# Patient Record
Sex: Male | Born: 1950 | Race: White | Hispanic: No | Marital: Married | Smoking: Never smoker
Health system: Southern US, Community
[De-identification: ages and names within clinical notes are randomized; demographics above are authoritative.]

## PROBLEM LIST (undated history)

## (undated) DIAGNOSIS — E039 Hypothyroidism, unspecified: Secondary | ICD-10-CM

## (undated) DIAGNOSIS — M722 Plantar fascial fibromatosis: Secondary | ICD-10-CM

## (undated) DIAGNOSIS — E785 Hyperlipidemia, unspecified: Secondary | ICD-10-CM

## (undated) DIAGNOSIS — E291 Testicular hypofunction: Secondary | ICD-10-CM

## (undated) DIAGNOSIS — F419 Anxiety disorder, unspecified: Secondary | ICD-10-CM

## (undated) DIAGNOSIS — F329 Major depressive disorder, single episode, unspecified: Secondary | ICD-10-CM

## (undated) DIAGNOSIS — N400 Enlarged prostate without lower urinary tract symptoms: Secondary | ICD-10-CM

## (undated) DIAGNOSIS — R079 Chest pain, unspecified: Secondary | ICD-10-CM

## (undated) DIAGNOSIS — F32A Depression, unspecified: Secondary | ICD-10-CM

## (undated) HISTORY — DX: Hypothyroidism, unspecified: E03.9

## (undated) HISTORY — DX: Hyperlipidemia, unspecified: E78.5

## (undated) HISTORY — DX: Testicular hypofunction: E29.1

## (undated) HISTORY — PX: SHOULDER SURGERY: SHX246

## (undated) HISTORY — DX: Benign prostatic hyperplasia without lower urinary tract symptoms: N40.0

## (undated) HISTORY — DX: Chest pain, unspecified: R07.9

---

## 2009-05-23 ENCOUNTER — Encounter: Admission: RE | Admit: 2009-05-23 | Discharge: 2009-05-23 | Payer: Self-pay

## 2012-07-02 ENCOUNTER — Other Ambulatory Visit: Payer: Self-pay | Admitting: *Deleted

## 2012-07-02 DIAGNOSIS — E785 Hyperlipidemia, unspecified: Secondary | ICD-10-CM

## 2012-07-09 ENCOUNTER — Ambulatory Visit
Admission: RE | Admit: 2012-07-09 | Discharge: 2012-07-09 | Disposition: A | Discharge status: Home / Self Care | Payer: No Typology Code available for payment source | Source: Ambulatory Visit | Attending: *Deleted | Admitting: *Deleted

## 2012-07-09 ENCOUNTER — Other Ambulatory Visit: Payer: Self-pay | Admitting: *Deleted

## 2012-07-09 DIAGNOSIS — E785 Hyperlipidemia, unspecified: Secondary | ICD-10-CM

## 2012-07-09 MED ORDER — IOHEXOL 300 MG/ML  SOLN: 75.0000 mL | Freq: Once | INTRAMUSCULAR | Status: AC | PRN

## 2012-07-13 ENCOUNTER — Inpatient Hospital Stay: Admission: RE | Admit: 2012-07-13 | Payer: Self-pay | Source: Ambulatory Visit

## 2013-04-27 ENCOUNTER — Encounter: Payer: Self-pay | Admitting: Cardiovascular Disease

## 2013-04-27 ENCOUNTER — Ambulatory Visit (INDEPENDENT_AMBULATORY_CARE_PROVIDER_SITE_OTHER): Payer: BC Managed Care – PPO | Admitting: Cardiovascular Disease

## 2013-04-27 VITALS — BP 132/84 | HR 64 | Ht 70.5 in | Wt 180.4 lb

## 2013-04-27 DIAGNOSIS — E785 Hyperlipidemia, unspecified: Secondary | ICD-10-CM | POA: Insufficient documentation

## 2013-04-27 DIAGNOSIS — R0602 Shortness of breath: Secondary | ICD-10-CM

## 2013-04-27 DIAGNOSIS — R079 Chest pain, unspecified: Secondary | ICD-10-CM | POA: Insufficient documentation

## 2013-04-27 NOTE — Progress Notes (Signed)
04/27/2013 Dylan Hale   [birth year]  [id number]  Primary Physician Pcp Not In System Primary Cardiologist: [name] MD Roseanne Reno   HPI:  Dylan Hale is a 62 year old fit-appearing married Caucasian male father of 4 children he works in the [employer] laboratory technician. He is self referred for evaluation of chest pain. His only cardiac risk factor is mild untreated hyperlipidemia. He does not smoke. There is no family history of heart disease. He's never had a heart attack or stroke.  He apparently had a stress test 7 years ago to his normal because of chest pain. Her last year, it has become progressively worse occurring several times a week lasting hours at a time. He probably had a CT scan of his chest recently at the request of his primary care physician looking for "plaque" which was apparently normal.   Current Outpatient Prescriptions  Medication Sig Dispense Refill  . aspirin EC 81 MG tablet Take 81 mg by mouth daily.      . Cholecalciferol (VITAMIN D-3) 1000 UNITS CAPS Take 2,000 Units by mouth daily.      Marland Kitchen COENZYME Q10-RED YEAST RICE PO Take 1 capsule by mouth daily.      Marland Kitchen levothyroxine (SYNTHROID, LEVOTHROID) 75 MCG tablet Take 1 tablet by mouth daily.      Marland Kitchen PARoxetine (PAXIL) 30 MG tablet Take 1 tablet by mouth daily.       No current facility-administered medications for this visit.    No Known Allergies  History   Social History  . Marital Status: Unknown    Spouse Name: N/A    Number of Children: N/A  . Years of Education: N/A   Occupational History  . Not on file.   Social History Main Topics  . Smoking status: Never Smoker   . Smokeless tobacco: Never Used  . Alcohol Use: 1 - 2 oz/week    2-4 drink(s) per week  . Drug Use: No  . Sexual Activity: Not on file   Other Topics Concern  . Not on file   Social History Narrative  . No narrative on file     Review of Systems: General: negative for chills, fever, night sweats or weight  changes.  Cardiovascular: negative for chest pain, dyspnea on exertion, edema, orthopnea, palpitations, paroxysmal nocturnal dyspnea or shortness of breath Dermatological: negative for rash Respiratory: negative for cough or wheezing Urologic: negative for hematuria Abdominal: negative for nausea, vomiting, diarrhea, bright red blood per rectum, melena, or hematemesis Neurologic: negative for visual changes, syncope, or dizziness All other systems reviewed and are otherwise negative except as noted above.    Blood pressure 132/84, pulse 64, height 5' 10.5" (1.791 m), weight 180 lb 6.4 oz (81.829 kg).  General appearance: alert and no distress Neck: no adenopathy, no carotid bruit, no JVD, supple, symmetrical, trachea midline and thyroid not enlarged, symmetric, no tenderness/mass/nodules Lungs: clear to auscultation bilaterally Heart: regular rate and rhythm, S1, S2 normal, no murmur, click, rub or gallop Abdomen: soft, non-tender; bowel sounds normal; no masses,  no organomegaly Extremities: extremities normal, atraumatic, no cyanosis or edema Pulses: 2+ and symmetric  EKG normal sinus rhythm of 64 without ST or T wave changes  ASSESSMENT AND PLAN:   Chest pain The patient has complained of chest pain for years and had a negative stress test approximately 7 years ago. Over the last year he's been under a lot of increased pressure at work and at home. He gets chest pain several times  a week lasting hours at a time associated with some difficulty breathing. It sounds like he had a CT of his chest in [place] in the recent past looking at coronary calcium score or "plaque" which was unremarkable  Hyperlipidemia Currently not on statin therapy at his request      [name] MD [facility], [facility] 04/27/2013 4:40 PM

## 2013-04-27 NOTE — Assessment & Plan Note (Signed)
Currently not on statin therapy at his request

## 2013-04-27 NOTE — Assessment & Plan Note (Addendum)
The patient has complained of chest pain for years and had a negative stress test approximately 7 years ago. Over the last year he's been under a lot of increased pressure at work and at home. He gets chest pain several times a week lasting hours at a time associated with some difficulty breathing. It sounds like he had a CT of his chest in [place] in the recent past looking at coronary calcium score or "plaque" which was unremarkable

## 2013-04-27 NOTE — Patient Instructions (Addendum)
  We will see you back in follow up after the tests  Dr Allyson Sabal has ordered an echocardiogram and exercise myoview stress test  Your physician has requested that you have an echocardiogram. Echocardiography is a painless test that uses sound waves to create images of your heart. It provides your doctor with information about the size and shape of your heart and how well your heart's chambers and valves are working. This procedure takes approximately one hour. There are no restrictions for this procedure.  Your physician has requested that you have en exercise stress myoview. For further information please visit [email or web address] Please follow instruction sheet, as given.

## 2013-05-02 ENCOUNTER — Ambulatory Visit (HOSPITAL_COMMUNITY)
Admission: RE | Admit: 2013-05-02 | Discharge: 2013-05-02 | Disposition: A | Discharge status: Home / Self Care | Payer: BC Managed Care – PPO | Source: Ambulatory Visit | Attending: Cardiology | Admitting: Cardiology

## 2013-05-02 ENCOUNTER — Encounter: Payer: Self-pay | Admitting: *Deleted

## 2013-05-02 DIAGNOSIS — E785 Hyperlipidemia, unspecified: Secondary | ICD-10-CM | POA: Insufficient documentation

## 2013-05-02 DIAGNOSIS — R079 Chest pain, unspecified: Secondary | ICD-10-CM

## 2013-05-02 DIAGNOSIS — R0602 Shortness of breath: Secondary | ICD-10-CM | POA: Insufficient documentation

## 2013-05-02 NOTE — Progress Notes (Signed)
2D Echo Performed 05/02/2013    Sagal Gayton, RCS  

## 2013-05-03 ENCOUNTER — Encounter: Payer: Self-pay | Admitting: *Deleted

## 2013-05-03 ENCOUNTER — Ambulatory Visit (HOSPITAL_COMMUNITY)
Admission: RE | Admit: 2013-05-03 | Discharge: 2013-05-03 | Disposition: A | Discharge status: Home / Self Care | Payer: BC Managed Care – PPO | Source: Ambulatory Visit | Attending: Cardiovascular Disease | Admitting: Cardiovascular Disease

## 2013-05-03 DIAGNOSIS — R0602 Shortness of breath: Secondary | ICD-10-CM

## 2013-05-03 DIAGNOSIS — R5381 Other malaise: Secondary | ICD-10-CM | POA: Insufficient documentation

## 2013-05-03 DIAGNOSIS — R002 Palpitations: Secondary | ICD-10-CM | POA: Insufficient documentation

## 2013-05-03 DIAGNOSIS — R079 Chest pain, unspecified: Secondary | ICD-10-CM

## 2013-05-03 MED ORDER — TECHNETIUM TC 99M SESTAMIBI GENERIC - CARDIOLITE
10.0000 | Freq: Once | INTRAVENOUS | Status: AC | PRN
  Administered 2013-05-03: 10 via INTRAVENOUS

## 2013-05-03 MED ORDER — TECHNETIUM TC 99M SESTAMIBI GENERIC - CARDIOLITE
30.0000 | Freq: Once | INTRAVENOUS | Status: AC | PRN
  Administered 2013-05-03: 30 via INTRAVENOUS

## 2013-05-03 NOTE — Procedures (Addendum)
[facility] [address] [place] [address] [phone]  Cardiology Nuclear Med Study  Dylan Hale is a 62 y.o. male     MRN : [id number]     DOB: [birth year]  Procedure Date: 05/03/2013  Nuclear Med Background Indication for Stress Test:  Evaluation for Ischemia History:  PT DENIES ANY PRIOR HISTORY Cardiac Risk Factors: Lipids and Overweight  Symptoms:  Chest Pain, Fatigue, Light-Headedness, Palpitations and SOB   Nuclear Pre-Procedure Caffeine/Decaff Intake:  7:00pm NPO After: 5:00am   IV Site: R Hand  IV 0.9% NS with Angio Cath:  22g  Chest Size (in):  42"  IV Started by: Emmit Pomfret, RN  Height: 5' 10.5" (1.791 m)  Cup Size: n/a  BMI:  Body mass index is 25.45 kg/(m^2). Weight:  180 lb (81.647 kg)   Tech Comments:  N/A    Nuclear Med Study 1 or 2 day study: 1 day  Stress Test Type:  Stress  Order Authorizing Provider:  Nanetta Batty, MD   Resting Radionuclide: Technetium 82m Sestamibi  Resting Radionuclide Dose: 10.5 mCi   Stress Radionuclide:  Technetium 22m Sestamibi  Stress Radionuclide Dose: 30.1 mCi           Stress Protocol Rest HR: 53 Stress HR: 160  Rest BP:120/89 Stress BP: 185/81  Exercise Time (min): 11:31 METS: 12.40   Predicted Max HR: 159 bpm % Max HR: 100.63 bpm Rate Pressure Product: 46962  Dose of Adenosine (mg):  n/a Dose of Lexiscan: n/a mg  Dose of Atropine (mg): n/a Dose of Dobutamine: n/a mcg/kg/min (at max HR)  Stress Test Technologist: Ernestene Mention, CCT Nuclear Technologist: Koren Shiver, CNMT   Rest Procedure:  Myocardial perfusion imaging was performed at rest 45 minutes following the intravenous administration of Technetium 58m Sestamibi. Stress Procedure:  The patient performed treadmill exercise using a Bruce  Protocol for 11 minutes and 31 seconds. The patient stopped due to shortness of breath and fatigue. Patient denied any chest pain.  There were no  significant ST-T wave changes.  Technetium 86m Sestamibi was injected at peak exercise and myocardial perfusion imaging was performed after a brief delay.  Transient Ischemic Dilatation (Normal <1.22):  0.86  Lung/Heart Ratio (Normal <0.45):  0.38 QGS EDV:  95 ml QGS ESV:  45 ml LV Ejection Fraction: 53%  Rest ECG: NSR - Normal EKG  Stress ECG: Uninterpretable due to baseline artifact  QPS Raw Data Images:  Normal; no motion artifact; normal heart/lung ratio. Stress Images:  Normal homogeneous uptake in all areas of the myocardium. Rest Images:  Normal homogeneous uptake in all areas of the myocardium. Subtraction (SDS):  No evidence of ischemia.  Impression Exercise Capacity:  Excellent exercise capacity. BP Response:  Hypertensive blood pressure response. Clinical Symptoms:  No significant symptoms noted. ECG Impression:  Stress EKG is uninterpretable due to baseline artifact, occasional PVC's Comparison with Prior Nuclear Study: No previous nuclear study performed  Overall Impression:  Normal stress nuclear study. EKG was uninterpretable at peak exercise due to lead artifact. Excellent exercise tolerance and no chest pain with exercise.  LV Wall Motion:  NL LV Function; NL Wall Motion; EF 53%  Chrystie Nose, MD, [facility] Board Certified in Nuclear Cardiology Attending Cardiologist [facility]  Chrystie Nose, MD  05/03/2013 12:02 PM

## 2013-05-23 ENCOUNTER — Ambulatory Visit (INDEPENDENT_AMBULATORY_CARE_PROVIDER_SITE_OTHER): Payer: BC Managed Care – PPO | Admitting: Cardiovascular Disease

## 2013-05-23 ENCOUNTER — Encounter: Payer: Self-pay | Admitting: Cardiovascular Disease

## 2013-05-23 VITALS — BP 138/88 | HR 60 | Ht 70.5 in | Wt 179.1 lb

## 2013-05-23 DIAGNOSIS — R079 Chest pain, unspecified: Secondary | ICD-10-CM

## 2013-05-23 NOTE — Progress Notes (Signed)
Dylan Hale returns today for followup of his outpatient diagnostic studies. A 2-D echo and Myoview stress test were normal. I reassured him that the likelihood of his symptoms being related to a cardiovascular etiology is small. I will see him back in one year  [name], M.D., [facility] [address] [place], [place]  [address]  [phone] 05/23/2013 10:26 AM

## 2013-05-23 NOTE — Assessment & Plan Note (Signed)
Myoview stress test and 2-D echo were entirely normal. I have reassured Dylan Hale that the likelihood of his symptoms being related to a cardiac cause a small especially in light of the fact that his cardiac CT angiogram was also normal.

## 2013-05-23 NOTE — Patient Instructions (Addendum)
Your physician wants you to follow-up in: 1 year with Dr Berry. You will receive a reminder letter in the mail two months in advance. If you don't receive a letter, please call our office to schedule the follow-up appointment.  

## 2013-05-31 ENCOUNTER — Encounter: Payer: Self-pay | Admitting: Cardiovascular Disease

## 2013-06-01 ENCOUNTER — Ambulatory Visit: Payer: Self-pay | Admitting: Cardiology

## 2013-06-02 ENCOUNTER — Ambulatory Visit: Payer: Self-pay | Admitting: Cardiology

## 2013-06-17 ENCOUNTER — Other Ambulatory Visit: Payer: Self-pay | Admitting: Family Medicine

## 2013-06-17 DIAGNOSIS — R05 Cough: Secondary | ICD-10-CM

## 2013-06-20 ENCOUNTER — Ambulatory Visit (INDEPENDENT_AMBULATORY_CARE_PROVIDER_SITE_OTHER): Payer: BC Managed Care – PPO

## 2013-06-20 DIAGNOSIS — R05 Cough: Secondary | ICD-10-CM

## 2013-06-20 DIAGNOSIS — R0602 Shortness of breath: Secondary | ICD-10-CM

## 2015-07-23 ENCOUNTER — Other Ambulatory Visit: Payer: Self-pay | Admitting: Orthopedic Surgery

## 2015-07-27 ENCOUNTER — Encounter (HOSPITAL_BASED_OUTPATIENT_CLINIC_OR_DEPARTMENT_OTHER): Payer: Self-pay | Admitting: *Deleted

## 2015-08-02 ENCOUNTER — Ambulatory Visit (HOSPITAL_BASED_OUTPATIENT_CLINIC_OR_DEPARTMENT_OTHER): Payer: BLUE CROSS/BLUE SHIELD | Admitting: Certified Registered"

## 2015-08-02 ENCOUNTER — Encounter (HOSPITAL_BASED_OUTPATIENT_CLINIC_OR_DEPARTMENT_OTHER): Payer: Self-pay

## 2015-08-02 ENCOUNTER — Encounter (HOSPITAL_BASED_OUTPATIENT_CLINIC_OR_DEPARTMENT_OTHER)
Admission: RE | Disposition: A | Discharge status: Home / Self Care | Payer: Self-pay | Source: Ambulatory Visit | Attending: Orthopedic Surgery

## 2015-08-02 ENCOUNTER — Ambulatory Visit (HOSPITAL_BASED_OUTPATIENT_CLINIC_OR_DEPARTMENT_OTHER)
Admission: RE | Admit: 2015-08-02 | Discharge: 2015-08-02 | Disposition: A | Discharge status: Home / Self Care | Payer: BLUE CROSS/BLUE SHIELD | Source: Ambulatory Visit | Attending: Orthopedic Surgery | Admitting: Orthopedic Surgery

## 2015-08-02 DIAGNOSIS — E291 Testicular hypofunction: Secondary | ICD-10-CM | POA: Diagnosis not present

## 2015-08-02 DIAGNOSIS — D2121 Benign neoplasm of connective and other soft tissue of right lower limb, including hip: Secondary | ICD-10-CM | POA: Diagnosis not present

## 2015-08-02 DIAGNOSIS — F329 Major depressive disorder, single episode, unspecified: Secondary | ICD-10-CM | POA: Insufficient documentation

## 2015-08-02 DIAGNOSIS — F419 Anxiety disorder, unspecified: Secondary | ICD-10-CM | POA: Diagnosis not present

## 2015-08-02 DIAGNOSIS — E785 Hyperlipidemia, unspecified: Secondary | ICD-10-CM | POA: Insufficient documentation

## 2015-08-02 DIAGNOSIS — M25571 Pain in right ankle and joints of right foot: Secondary | ICD-10-CM

## 2015-08-02 DIAGNOSIS — Z79899 Other long term (current) drug therapy: Secondary | ICD-10-CM | POA: Diagnosis not present

## 2015-08-02 DIAGNOSIS — Z7982 Long term (current) use of aspirin: Secondary | ICD-10-CM | POA: Insufficient documentation

## 2015-08-02 DIAGNOSIS — E039 Hypothyroidism, unspecified: Secondary | ICD-10-CM | POA: Insufficient documentation

## 2015-08-02 DIAGNOSIS — N400 Enlarged prostate without lower urinary tract symptoms: Secondary | ICD-10-CM | POA: Diagnosis not present

## 2015-08-02 HISTORY — DX: Plantar fascial fibromatosis: M72.2

## 2015-08-02 HISTORY — DX: Depression, unspecified: F32.A

## 2015-08-02 HISTORY — DX: Major depressive disorder, single episode, unspecified: F32.9

## 2015-08-02 HISTORY — PX: MASS EXCISION: SHX2000

## 2015-08-02 HISTORY — DX: Anxiety disorder, unspecified: F41.9

## 2015-08-02 SURGERY — EXCISION MASS
Anesthesia: General | Site: Foot | Laterality: Right

## 2015-08-02 MED ORDER — FENTANYL CITRATE (PF) 100 MCG/2ML IJ SOLN
INTRAMUSCULAR | Status: AC
  Filled 2015-08-02: qty 2

## 2015-08-02 MED ORDER — HYDROCODONE-ACETAMINOPHEN 5-325 MG PO TABS: 1.0000 | ORAL_TABLET | ORAL | Status: DC | PRN

## 2015-08-02 MED ORDER — DEXAMETHASONE SODIUM PHOSPHATE 10 MG/ML IJ SOLN
INTRAMUSCULAR | Status: DC | PRN
Start: 2015-08-02 — End: 2015-08-02
  Administered 2015-08-02: 10 mg via INTRAVENOUS

## 2015-08-02 MED ORDER — MIDAZOLAM HCL 2 MG/2ML IJ SOLN
INTRAMUSCULAR | Status: AC
  Filled 2015-08-02: qty 2

## 2015-08-02 MED ORDER — LIDOCAINE HCL (CARDIAC) 20 MG/ML IV SOLN
INTRAVENOUS | Status: AC
  Filled 2015-08-02: qty 5

## 2015-08-02 MED ORDER — DEXAMETHASONE SODIUM PHOSPHATE 10 MG/ML IJ SOLN
INTRAMUSCULAR | Status: AC
  Filled 2015-08-02: qty 1

## 2015-08-02 MED ORDER — LACTATED RINGERS IV SOLN
INTRAVENOUS | Status: DC
  Administered 2015-08-02 (×2): via INTRAVENOUS

## 2015-08-02 MED ORDER — PROPOFOL 10 MG/ML IV BOLUS
INTRAVENOUS | Status: DC | PRN
  Administered 2015-08-02: 200 mg via INTRAVENOUS

## 2015-08-02 MED ORDER — EPHEDRINE SULFATE 50 MG/ML IJ SOLN
INTRAMUSCULAR | Status: DC | PRN
  Administered 2015-08-02: 10 mg via INTRAVENOUS

## 2015-08-02 MED ORDER — FENTANYL CITRATE (PF) 100 MCG/2ML IJ SOLN
50.0000 ug | INTRAMUSCULAR | Status: DC | PRN
  Administered 2015-08-02: 100 ug via INTRAVENOUS

## 2015-08-02 MED ORDER — GLYCOPYRROLATE 0.2 MG/ML IJ SOLN: 0.2000 mg | Freq: Once | INTRAMUSCULAR | Status: DC | PRN

## 2015-08-02 MED ORDER — ONDANSETRON HCL 4 MG/2ML IJ SOLN
INTRAMUSCULAR | Status: AC
  Filled 2015-08-02: qty 2

## 2015-08-02 MED ORDER — ONDANSETRON HCL 4 MG/2ML IJ SOLN
INTRAMUSCULAR | Status: DC | PRN
  Administered 2015-08-02: 4 mg via INTRAVENOUS

## 2015-08-02 MED ORDER — SCOPOLAMINE 1 MG/3DAYS TD PT72: 1.0000 | MEDICATED_PATCH | Freq: Once | TRANSDERMAL | Status: DC | PRN

## 2015-08-02 MED ORDER — CHLORHEXIDINE GLUCONATE 4 % EX LIQD: 60.0000 mL | Freq: Once | CUTANEOUS | Status: DC

## 2015-08-02 MED ORDER — MIDAZOLAM HCL 2 MG/2ML IJ SOLN
1.0000 mg | INTRAMUSCULAR | Status: DC | PRN
  Administered 2015-08-02: 2 mg via INTRAVENOUS

## 2015-08-02 MED ORDER — PROPOFOL 10 MG/ML IV BOLUS
INTRAVENOUS | Status: AC
  Filled 2015-08-02: qty 20

## 2015-08-02 MED ORDER — DOCUSATE SODIUM 100 MG PO CAPS: 100.0000 mg | ORAL_CAPSULE | Freq: Two times a day (BID) | ORAL | Status: DC

## 2015-08-02 MED ORDER — SODIUM CHLORIDE 0.9 % IV SOLN: INTRAVENOUS | Status: DC

## 2015-08-02 MED ORDER — CEFAZOLIN SODIUM-DEXTROSE 2-3 GM-% IV SOLR
2.0000 g | INTRAVENOUS | Status: AC
  Administered 2015-08-02: 2 g via INTRAVENOUS

## 2015-08-02 MED ORDER — CEFAZOLIN SODIUM-DEXTROSE 2-3 GM-% IV SOLR
INTRAVENOUS | Status: AC
  Filled 2015-08-02: qty 50

## 2015-08-02 MED ORDER — SENNA 8.6 MG PO TABS: 2.0000 | ORAL_TABLET | Freq: Two times a day (BID) | ORAL | Status: DC

## 2015-08-02 MED ORDER — EPHEDRINE SULFATE 50 MG/ML IJ SOLN
INTRAMUSCULAR | Status: AC
  Filled 2015-08-02: qty 1

## 2015-08-02 SURGICAL SUPPLY — 65 items
BANDAGE ESMARK 6X9 LF (GAUZE/BANDAGES/DRESSINGS) IMPLANT
BLADE SURG 15 STRL LF DISP TIS (BLADE) ×1 IMPLANT
BLADE SURG 15 STRL SS (BLADE) ×2
BNDG COHESIVE 4X5 TAN STRL (GAUZE/BANDAGES/DRESSINGS) IMPLANT
BNDG COHESIVE 6X5 TAN STRL LF (GAUZE/BANDAGES/DRESSINGS) IMPLANT
BNDG CONFORM 3 STRL LF (GAUZE/BANDAGES/DRESSINGS) IMPLANT
BNDG ESMARK 4X9 LF (GAUZE/BANDAGES/DRESSINGS) IMPLANT
BNDG ESMARK 6X9 LF (GAUZE/BANDAGES/DRESSINGS)
CHLORAPREP W/TINT 26ML (MISCELLANEOUS) ×3 IMPLANT
CLOSURE WOUND 1/2 X4 (GAUZE/BANDAGES/DRESSINGS)
COVER BACK TABLE 60X90IN (DRAPES) ×3 IMPLANT
CUFF TOURNIQUET SINGLE 34IN LL (TOURNIQUET CUFF) IMPLANT
DRAPE EXTREMITY T 121X128X90 (DRAPE) ×3 IMPLANT
DRAPE OEC MINIVIEW 54X84 (DRAPES) IMPLANT
DRAPE SURG 17X23 STRL (DRAPES) IMPLANT
DRAPE U-SHAPE 47X51 STRL (DRAPES) IMPLANT
DRSG MEPITEL 4X7.2 (GAUZE/BANDAGES/DRESSINGS) ×3 IMPLANT
DRSG PAD ABDOMINAL 8X10 ST (GAUZE/BANDAGES/DRESSINGS) ×3 IMPLANT
ELECT REM PT RETURN 9FT ADLT (ELECTROSURGICAL) ×3
ELECTRODE REM PT RTRN 9FT ADLT (ELECTROSURGICAL) ×1 IMPLANT
GAUZE SPONGE 4X4 12PLY STRL (GAUZE/BANDAGES/DRESSINGS) ×6 IMPLANT
GAUZE SPONGE 4X4 16PLY XRAY LF (GAUZE/BANDAGES/DRESSINGS) IMPLANT
GLOVE BIO SURGEON STRL SZ8 (GLOVE) ×3 IMPLANT
GLOVE BIOGEL PI IND STRL 7.0 (GLOVE) ×1 IMPLANT
GLOVE BIOGEL PI IND STRL 8 (GLOVE) ×2 IMPLANT
GLOVE BIOGEL PI INDICATOR 7.0 (GLOVE) ×2
GLOVE BIOGEL PI INDICATOR 8 (GLOVE) ×4
GLOVE ECLIPSE 6.5 STRL STRAW (GLOVE) ×3 IMPLANT
GLOVE ECLIPSE 7.5 STRL STRAW (GLOVE) ×3 IMPLANT
GLOVE EXAM NITRILE MD LF STRL (GLOVE) IMPLANT
GOWN STRL REUS W/ TWL LRG LVL3 (GOWN DISPOSABLE) ×1 IMPLANT
GOWN STRL REUS W/ TWL XL LVL3 (GOWN DISPOSABLE) ×2 IMPLANT
GOWN STRL REUS W/TWL LRG LVL3 (GOWN DISPOSABLE) ×2
GOWN STRL REUS W/TWL XL LVL3 (GOWN DISPOSABLE) ×4
NEEDLE HYPO 22GX1.5 SAFETY (NEEDLE) IMPLANT
NEEDLE HYPO 25X1 1.5 SAFETY (NEEDLE) IMPLANT
NS IRRIG 1000ML POUR BTL (IV SOLUTION) ×3 IMPLANT
PACK BASIN DAY SURGERY FS (CUSTOM PROCEDURE TRAY) ×3 IMPLANT
PAD CAST 4YDX4 CTTN HI CHSV (CAST SUPPLIES) ×1 IMPLANT
PADDING CAST ABS 4INX4YD NS (CAST SUPPLIES)
PADDING CAST ABS COTTON 4X4 ST (CAST SUPPLIES) IMPLANT
PADDING CAST COTTON 4X4 STRL (CAST SUPPLIES) ×2
PADDING CAST COTTON 6X4 STRL (CAST SUPPLIES) IMPLANT
PENCIL BUTTON HOLSTER BLD 10FT (ELECTRODE) IMPLANT
SANITIZER HAND PURELL 535ML FO (MISCELLANEOUS) ×3 IMPLANT
SHEET MEDIUM DRAPE 40X70 STRL (DRAPES) ×3 IMPLANT
SLEEVE SCD COMPRESS KNEE MED (MISCELLANEOUS) ×3 IMPLANT
SPONGE LAP 18X18 X RAY DECT (DISPOSABLE) ×3 IMPLANT
STOCKINETTE 6  STRL (DRAPES) ×2
STOCKINETTE 6 STRL (DRAPES) ×1 IMPLANT
STRIP CLOSURE SKIN 1/2X4 (GAUZE/BANDAGES/DRESSINGS) IMPLANT
SUCTION FRAZIER TIP 10 FR DISP (SUCTIONS) IMPLANT
SUT ETHILON 3 0 PS 1 (SUTURE) IMPLANT
SUT ETHILON 4 0 PS 2 18 (SUTURE) IMPLANT
SUT MNCRL AB 3-0 PS2 18 (SUTURE) IMPLANT
SUT MNCRL AB 4-0 PS2 18 (SUTURE) IMPLANT
SUT VIC AB 0 SH 27 (SUTURE) IMPLANT
SUT VIC AB 2-0 PS2 27 (SUTURE) IMPLANT
SYR BULB 3OZ (MISCELLANEOUS) ×3 IMPLANT
SYR CONTROL 10ML LL (SYRINGE) IMPLANT
TOWEL OR 17X24 6PK STRL BLUE (TOWEL DISPOSABLE) ×3 IMPLANT
TUBE CONNECTING 20'X1/4 (TUBING)
TUBE CONNECTING 20X1/4 (TUBING) IMPLANT
UNDERPAD 30X30 (UNDERPADS AND DIAPERS) ×3 IMPLANT
YANKAUER SUCT BULB TIP NO VENT (SUCTIONS) IMPLANT

## 2015-08-02 NOTE — Brief Op Note (Signed)
08/02/2015  9:52 AM  PATIENT:  [name]  64 y.o. male  PRE-OPERATIVE DIAGNOSIS:  Right foot plantar fibroma  POST-OPERATIVE DIAGNOSIS:  right foot plantar fibroma  Procedure(s): EXCISION OF RIGHT FOOT MASS 1 cm x 2 cm x 1 cm deep to the fascia  SURGEON:  Wylene Simmer, MD  ASSISTANT: Mechele Claude, PA-C  ANESTHESIA:   General, regional  EBL:  minimal   TOURNIQUET:   Total Tourniquet Time Documented: Leg (Right) - 8 minutes Total: Leg (Right) - 8 minutes  COMPLICATIONS:  None apparent  DISPOSITION:  Extubated, awake and stable to recovery.  DICTATION ID:  [id number]

## 2015-08-02 NOTE — Op Note (Signed)
NAME:  MAZIN, FLORKOWSKI NO.:  [id number]  MEDICAL RECORD NO.:  [id number]  LOCATION:                                 FACILITY:  PHYSICIAN:  Wylene Simmer, MD             DATE OF BIRTH:  DATE OF PROCEDURE:  08/02/2015 DATE OF DISCHARGE:                              OPERATIVE REPORT   PREOPERATIVE DIAGNOSIS:  Right foot plantar fibroma.  PREOPERATIVE DIAGNOSIS:  Right foot plantar fibroma.  PROCEDURE:  Excision of right foot mass, 1 cm x 2 cm x 1 cm deep to the fascia.  SURGEON:  Wylene Simmer, MD  ASSISTANT:  Mechele Claude, PA-C.  ANESTHESIA:  General, regional.  ESTIMATED BLOOD LOSS:  Minimal.  TOURNIQUET TIME:  8 minutes with an ankle Esmarch.  COMPLICATIONS:  None apparent.  DISPOSITION:  Extubated, awake, and stable to recovery.  INDICATIONS FOR PROCEDURE:  The patient is a 64 year old male, who complains of a painful mass, this has been slowly growing at the right plantar aspect of his forefoot.  He has failed nonoperative treatment and presents today for surgical excision.  He understands the risks and benefits, the alternative treatment options, and elects surgical treatment.  He specifically understands the risks of bleeding, infection, nerve damage, blood clots, need for additional surgery, continued pain, recurrence of the mass, amputation and death.  PROCEDURE IN DETAIL:  After preoperative consent was obtained and the correct operative site was identified, the patient was brought to the operating room and placed supine on the operating table.  General anesthesia was induced.  Preoperative antibiotics were administered. Surgical time-out was taken.  The right lower extremity was prepped and draped in standard sterile fashion.  Foot was exsanguinated and a 4-inch Esmarch tourniquet was wrapped around the ankle.  A longitudinal incision was then made medial to the mass at the glabrous border of the skin.  Sharp dissection was carried down  through the skin.  Blunt dissection was then carried down through the subcutaneous tissue to the medial aspect of the mass.  The mass was then bluntly dissected circumferentially.  It was continuous with the plantar fascia.  The mass was then excised in its entirety and passed off the field as a specimen to Pathology.  The plantar fascia was then palpated and another nodule was noted just lateral to the first.  This was also excised and sent to Pathology.  The wound was irrigated copiously.  Tourniquet was released. Hemostasis was achieved.  The incision was closed with horizontal mattress sutures of 3-0 nylon.  Sterile dressings were applied followed by compression wrap.  The patient was awakened from anesthesia and transported to the recovery room in stable condition.  FOLLOWUP PLAN:  The patient will be weightbearing as tolerated on his right foot in a flat postop shoe.  He will follow up with me in the office in 2 weeks for suture removal and path results.  Mechele Claude, PA-C was present and scrubbed for the duration of the case.  His assistance was essential in positioning of the patient, prepping and draping, gaining and maintaining exposure, performing the operation, closing and dressing the wounds and applying the splint.  Wylene Simmer, MD     JH/MEDQ  D:  08/02/2015  T:  08/02/2015  Job:  [id number]

## 2015-08-02 NOTE — Anesthesia Postprocedure Evaluation (Signed)
Anesthesia Post Note  Patient: Dylan Hale  Procedure(s) Performed: Procedure(s) (LRB): EXCISION OF RIGHT FOOT MASS (Right)  Patient location during evaluation: PACU Anesthesia Type: General Level of consciousness: sedated Pain management: pain level controlled Vital Signs Assessment: post-procedure vital signs reviewed and stable Respiratory status: spontaneous breathing and respiratory function stable Cardiovascular status: stable Anesthetic complications: no    Last Vitals:  Filed Vitals:   08/02/15 0945 08/02/15 1000  BP: 97/68 115/68  Pulse: 55   Temp:    Resp: 11     Last Pain:  Filed Vitals:   08/02/15 1002  PainSc: Asleep          RLE Sensation: Numbness (08/02/15 1000)      Ari Engelbrecht DANIEL

## 2015-08-02 NOTE — H&P (Deleted)
Dylan Hale is an 64 y.o. male.   Chief Complaint: right foot mass HPI: 64 y/o male with right plantar foot mass that is painful and slowly growing.  He desires surgical excision.  Past Medical History  Diagnosis Date  . Hypothyroidism   . Hyperlipidemia   . Enlarged prostate   . Hypogonadism male   . Chest pain   . Anxiety   . Depression   . Plantar fibromatosis     right foot    History reviewed. No pertinent past surgical history.  Family History  Problem Relation Age of Onset  . Anxiety disorder Mother   . Prostate cancer Father   . Colon cancer Maternal Grandmother   . Emphysema Maternal Grandfather   . Lung cancer Paternal Grandfather    Social History:  reports that he has never smoked. He has never used smokeless tobacco. He reports that he drinks about 1.0 - 2.0 oz of alcohol per week. He reports that he does not use illicit drugs.  Allergies: No Known Allergies  Medications Prior to Admission  Medication Sig Dispense Refill  . aspirin EC 81 MG tablet Take 81 mg by mouth daily.    . Cholecalciferol (VITAMIN D-3) 1000 UNITS CAPS Take 2,000 Units by mouth daily.    Marland Kitchen COENZYME Q10-RED YEAST RICE PO Take 1 capsule by mouth daily.    Marland Kitchen levothyroxine (SYNTHROID, LEVOTHROID) 75 MCG tablet Take 1 tablet by mouth daily.    . Omega-3 Fatty Acids (FISH OIL) 1000 MG CAPS Take by mouth.    Marland Kitchen PARoxetine (PAXIL) 30 MG tablet Take 1 tablet by mouth daily.    Marland Kitchen testosterone (ANDROGEL) 50 MG/5GM (1%) GEL Place 5 g onto the skin daily.      No results found for this or any previous visit (from the past 48 hour(s)). No results found.  ROS  No recent f/c/n/v/wt loss  Blood pressure 128/78, pulse 61, temperature 97.1 F (36.2 C), temperature source Oral, resp. rate 17, height 5' 10.5" (1.791 m), weight 81.194 kg (179 lb), SpO2 92 %. Physical Exam  wn wd male in nad.  A and O x 4.  Mood and affect normal.  EOMI.  resp unlabored.  L foot with healthy skin.  No lymphadenopathy.   TTP at 5th MT base.  5/5 strength at peroneus brevis (but painful).  Pulses palpable in the foot. 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  The risks and benefits of the alternative treatment options have been discussed in detail.  The patient wishes to proceed with surgery and specifically understands risks of bleeding, infection, nerve damage, blood clots, need for additional surgery, amputation and death.   Wylene Simmer 08-19-2015, 9:40 AM

## 2015-08-02 NOTE — Anesthesia Procedure Notes (Addendum)
Anesthesia Regional Block:  Ankle block  Pre-Anesthetic Checklist: ,, timeout performed, Correct Patient, Correct Site, Correct Laterality, Correct Procedure, Correct Position, site marked, Risks and benefits discussed,  Surgical consent,  Pre-op evaluation,  At surgeon's request and post-op pain management  Laterality: Right  Prep: chloraprep       Needles:  Injection technique: Single-shot  Needle Type: Echogenic Stimulator Needle     Needle Length: 5cm 5 cm Needle Gauge: 22 and 22 G    Additional Needles:  Procedures: ultrasound guided (picture in chart) Ankle block Narrative:  Start time: 08/02/2015 8:38 AM End time: 08/02/2015 8:48 AM Injection made incrementally with aspirations every 5 mL.  Performed by: Personally  Anesthesiologist: Duane Boston  Additional Notes: Posterior tibial nerve block ONLY A functioning IV was confirmed and monitors were applied.  Sterile prep and drape, hand hygiene and sterile gloves were used.  Negative aspiration and test dose prior to incremental administration of local anesthetic. The patient tolerated the procedure well.Ultrasound  guidance: relevant anatomy identified, needle position confirmed, local anesthetic spread visualized around nerve(s), vascular puncture avoided.  Image printed for medical record.    Procedure Name: LMA Insertion Date/Time: 08/02/2015 9:15 AM Performed by: Hillari Zumwalt D Pre-anesthesia Checklist: Patient identified, Emergency Drugs available, Suction available and Patient being monitored Patient Re-evaluated:Patient Re-evaluated prior to inductionOxygen Delivery Method: Circle System Utilized Preoxygenation: Pre-oxygenation with 100% oxygen Intubation Type: IV induction Ventilation: Mask ventilation without difficulty LMA: LMA inserted LMA Size: 4.0 Number of attempts: 1 Airway Equipment and Method: Bite block Placement Confirmation: positive ETCO2 Tube secured with: Tape Dental Injury: Teeth and  Oropharynx as per pre-operative assessment

## 2015-08-02 NOTE — Progress Notes (Signed)
Assisted Dr. Tobias Alexander with right, ultrasound guided, ankle/ post/tib block. Side rails up, monitors on throughout procedure. See vital signs in flow sheet. Tolerated Procedure well.

## 2015-08-02 NOTE — Discharge Instructions (Addendum)
Dylan Simmer, MD [place]  Please read the following information regarding your care after surgery.  Medications  You only need a prescription for the narcotic pain medicine (ex. oxycodone, Percocet, Norco).  All of the other medicines listed below are available over the counter. X acetominophen (Tylenol) 650 mg every 4-6 hours as you need for minor pain X hydrocodone as prescribed for moderate to severe pain ?   Narcotic pain medicine (ex. oxycodone, Percocet, Vicodin) will cause constipation.  To prevent this problem, take the following medicines while you are taking any pain medicine. X docusate sodium (Colace) 100 mg twice a day X senna (Senokot) 2 tablets twice a day   Weight Bearing X Bear weight when you are able on your operated leg or foot in flat post-op shoe.  Cast / Splint / Dressing X Keep your dressing, splint or cast clean and dry.  Dont put anything (coat hanger, pencil, etc) down inside of it.  If it gets damp, use a hair dryer on the cool setting to dry it.  If it gets soaked, call the office to schedule an appointment for a cast change.   After your dressing, cast or splint is removed; you may shower, but do not soak or scrub the wound.  Allow the water to run over it, and then gently pat it dry.  Swelling It is normal for you to have swelling where you had surgery.  To reduce swelling and pain, keep your toes above your nose for at least 3 days after surgery.  It may be necessary to keep your foot or leg elevated for several weeks.  If it hurts, it should be elevated.  Follow Up Call my office at [phone] when you are discharged from the hospital or surgery center to schedule an appointment to be seen two weeks after surgery.  Call my office at [phone] if you develop a fever >101.5 F, nausea, vomiting, bleeding from the surgical site or severe pain.     Post Anesthesia Home Care Instructions  Activity: Get plenty of rest for the  remainder of the day. A responsible adult should stay with you for 24 hours following the procedure.  For the next 24 hours, DO NOT: -Drive a car -Paediatric nurse -Drink alcoholic beverages -Take any medication unless instructed by your physician -Make any legal decisions or sign important papers.  Meals: Start with liquid foods such as gelatin or soup. Progress to regular foods as tolerated. Avoid greasy, spicy, heavy foods. If nausea and/or vomiting occur, drink only clear liquids until the nausea and/or vomiting subsides. Call your physician if vomiting continues.  Special Instructions/Symptoms: Your throat may feel dry or sore from the anesthesia or the breathing tube placed in your throat during surgery. If this causes discomfort, gargle with warm salt water. The discomfort should disappear within 24 hours.  If you had a scopolamine patch placed behind your ear for the management of post- operative nausea and/or vomiting:  1. The medication in the patch is effective for 72 hours, after which it should be removed.  Wrap patch in a tissue and discard in the trash. Wash hands thoroughly with soap and water. 2. You may remove the patch earlier than 72 hours if you experience unpleasant side effects which may include dry mouth, dizziness or visual disturbances. 3. Avoid touching the patch. Wash your hands with soap and water after contact with the patch.

## 2015-08-02 NOTE — H&P (Signed)
Dylan Hale is an 64 y.o. male.   Chief Complaint: right foot pain HPI:  64 y/o male with painful nodule at the medial arch proximal to the sesamoids.  He has failed non operative treatment and presents today for surgical excision.  Past Medical History  Diagnosis Date  . Hypothyroidism   . Hyperlipidemia   . Enlarged prostate   . Hypogonadism male   . Chest pain   . Anxiety   . Depression   . Plantar fibromatosis     right foot    History reviewed. No pertinent past surgical history.  Family History  Problem Relation Age of Onset  . Anxiety disorder Mother   . Prostate cancer Father   . Colon cancer Maternal Grandmother   . Emphysema Maternal Grandfather   . Lung cancer Paternal Grandfather    Social History:  reports that he has never smoked. He has never used smokeless tobacco. He reports that he drinks about 1.0 - 2.0 oz of alcohol per week. He reports that he does not use illicit drugs.  Allergies: No Known Allergies  Medications Prior to Admission  Medication Sig Dispense Refill  . aspirin EC 81 MG tablet Take 81 mg by mouth daily.    . Cholecalciferol (VITAMIN D-3) 1000 UNITS CAPS Take 2,000 Units by mouth daily.    Marland Kitchen COENZYME Q10-RED YEAST RICE PO Take 1 capsule by mouth daily.    Marland Kitchen levothyroxine (SYNTHROID, LEVOTHROID) 75 MCG tablet Take 1 tablet by mouth daily.    . Omega-3 Fatty Acids (FISH OIL) 1000 MG CAPS Take by mouth.    Marland Kitchen PARoxetine (PAXIL) 30 MG tablet Take 1 tablet by mouth daily.    Marland Kitchen testosterone (ANDROGEL) 50 MG/5GM (1%) GEL Place 5 g onto the skin daily.      No results found for this or any previous visit (from the past 48 hour(s)). No results found.  ROS  No recent f/c/n/v/wt loss.  Blood pressure 123/85, pulse 57, temperature 97.1 F (36.2 C), temperature source Oral, resp. rate 15, height 5' 10.5" (1.791 m), weight 81.194 kg (179 lb), SpO2 97 %. Physical Exam  wn wd male in nad.  A and O x 4.  MOod and affect normal.  EOMI.  resp  unlabored.  R foot with 2 cm nodular thickening at the plantar fascia proximal tot he medial sesamoid.  NTTP.  Skin healthy and intact.  Sens to LT intact at the medial plantar nerve dist.  2+ dp and pt pulses.  5/5 strength at the fHL.  Assessment/Plan R forefoot plantar fibroma  - to OR for surgical excision.  The risks and benefits of the alternative treatment options have been discussed in detail.  The patient wishes to proceed with surgery and specifically understands risks of bleeding, infection, nerve damage, blood clots, need for additional surgery, amputation and death.   Wylene Simmer 08/22/15, 8:48 AM

## 2015-08-02 NOTE — Anesthesia Preprocedure Evaluation (Signed)
Anesthesia Evaluation  Patient identified by MRN, date of birth, ID band Patient awake    Reviewed: Allergy & Precautions, H&P , NPO status , Patient's Chart, lab work & pertinent test results  Airway Mallampati: I  TM Distance: >3 FB Neck ROM: full    Dental  (+) Teeth Intact, Dental Advidsory Given   Pulmonary neg pulmonary ROS,    breath sounds clear to auscultation       Cardiovascular negative cardio ROS   Rhythm:regular Rate:Normal     Neuro/Psych PSYCHIATRIC DISORDERS negative neurological ROS     GI/Hepatic negative GI ROS, Neg liver ROS,   Endo/Other  Hypothyroidism   Renal/GU negative Renal ROS     Musculoskeletal   Abdominal   Peds  Hematology   Anesthesia Other Findings   Reproductive/Obstetrics negative OB ROS                             Anesthesia Physical Anesthesia Plan  ASA: II  Anesthesia Plan: General LMA   Post-op Pain Management: GA combined w/ Regional for post-op pain   Induction:   Airway Management Planned:   Additional Equipment:   Intra-op Plan:   Post-operative Plan:   Informed Consent: I have reviewed the patients History and Physical, chart, labs and discussed the procedure including the risks, benefits and alternatives for the proposed anesthesia with the patient or authorized representative who has indicated his/her understanding and acceptance.   Dental Advisory Given  Plan Discussed with: Anesthesiologist, Surgeon and CRNA  Anesthesia Plan Comments:         Anesthesia Quick Evaluation

## 2015-08-02 NOTE — Transfer of Care (Signed)
Immediate Anesthesia Transfer of Care Note  Patient: Dylan Hale  Procedure(s) Performed: Procedure(s): EXCISION OF RIGHT FOOT MASS (Right)  Patient Location: PACU  Anesthesia Type:GA combined with regional for post-op pain  Level of Consciousness: awake and patient cooperative  Airway & Oxygen Therapy: Patient Spontanous Breathing and Patient connected to face mask oxygen  Post-op Assessment: Report given to RN and Post -op Vital signs reviewed and stable  Post vital signs: Reviewed and stable  Last Vitals:  Filed Vitals:   08/02/15 0853 08/02/15 0854  BP:    Pulse: 55 61  Temp:    Resp: 10 17    Complications: No apparent anesthesia complications

## 2015-08-03 ENCOUNTER — Encounter (HOSPITAL_BASED_OUTPATIENT_CLINIC_OR_DEPARTMENT_OTHER): Payer: Self-pay | Admitting: Orthopedic Surgery

## 2016-12-09 ENCOUNTER — Encounter (HOSPITAL_BASED_OUTPATIENT_CLINIC_OR_DEPARTMENT_OTHER): Payer: BLUE CROSS/BLUE SHIELD | Attending: Surgery

## 2016-12-09 DIAGNOSIS — L02522 Furuncle left hand: Secondary | ICD-10-CM | POA: Diagnosis not present

## 2016-12-09 DIAGNOSIS — L209 Atopic dermatitis, unspecified: Secondary | ICD-10-CM | POA: Diagnosis not present

## 2016-12-09 DIAGNOSIS — E039 Hypothyroidism, unspecified: Secondary | ICD-10-CM | POA: Diagnosis not present

## 2017-01-01 ENCOUNTER — Other Ambulatory Visit (HOSPITAL_COMMUNITY): Payer: Self-pay | Admitting: Internal Medicine

## 2017-01-01 DIAGNOSIS — E042 Nontoxic multinodular goiter: Secondary | ICD-10-CM

## 2017-01-09 ENCOUNTER — Ambulatory Visit (HOSPITAL_COMMUNITY)
Admission: RE | Admit: 2017-01-09 | Discharge: 2017-01-09 | Disposition: A | Discharge status: Home / Self Care | Payer: BLUE CROSS/BLUE SHIELD | Source: Ambulatory Visit | Attending: Internal Medicine | Admitting: Internal Medicine

## 2017-01-09 DIAGNOSIS — E010 Iodine-deficiency related diffuse (endemic) goiter: Secondary | ICD-10-CM | POA: Insufficient documentation

## 2017-01-09 DIAGNOSIS — E042 Nontoxic multinodular goiter: Secondary | ICD-10-CM

## 2017-05-11 ENCOUNTER — Ambulatory Visit (INDEPENDENT_AMBULATORY_CARE_PROVIDER_SITE_OTHER): Payer: BLUE CROSS/BLUE SHIELD | Admitting: Physician Assistant

## 2017-05-11 ENCOUNTER — Encounter: Payer: Self-pay | Admitting: Physician Assistant

## 2017-05-11 VITALS — BP 108/80 | HR 55 | Temp 98.4°F | Resp 14 | Ht 71.0 in | Wt 185.0 lb

## 2017-05-11 DIAGNOSIS — M130 Polyarthritis, unspecified: Secondary | ICD-10-CM | POA: Diagnosis not present

## 2017-05-11 LAB — CBC WITH DIFFERENTIAL/PLATELET
BASOS ABS: 0.1 10*3/uL (ref 0.0–0.1)
Basophils Relative: 2.2 % (ref 0.0–3.0)
EOS ABS: 0.6 10*3/uL (ref 0.0–0.7)
Eosinophils Relative: 11.4 % — ABNORMAL HIGH (ref 0.0–5.0)
HEMATOCRIT: 52 % (ref 39.0–52.0)
Hemoglobin: 17.8 g/dL — ABNORMAL HIGH (ref 13.0–17.0)
LYMPHS PCT: 29.5 % (ref 12.0–46.0)
Lymphs Abs: 1.5 10*3/uL (ref 0.7–4.0)
MCHC: 34.3 g/dL (ref 30.0–36.0)
MCV: 95.1 fl (ref 78.0–100.0)
Monocytes Absolute: 0.5 10*3/uL (ref 0.1–1.0)
Monocytes Relative: 8.7 % (ref 3.0–12.0)
NEUTROS ABS: 2.5 10*3/uL (ref 1.4–7.7)
Neutrophils Relative %: 48.2 % (ref 43.0–77.0)
PLATELETS: 221 10*3/uL (ref 150.0–400.0)
RBC: 5.47 Mil/uL (ref 4.22–5.81)
RDW: 13.2 % (ref 11.5–15.5)
WBC: 5.2 10*3/uL (ref 4.0–10.5)

## 2017-05-11 LAB — TSH: TSH: 1.35 u[IU]/mL (ref 0.35–4.50)

## 2017-05-11 LAB — SEDIMENTATION RATE: Sed Rate: 1 mm/hr (ref 0–20)

## 2017-05-11 NOTE — Progress Notes (Signed)
Pre visit review using our clinic review tool, if applicable. No additional management support is needed unless otherwise documented below in the visit note. 

## 2017-05-11 NOTE — Progress Notes (Signed)
Patient presents to clinic today as a new patient, c/o migrating myalgias and arthralgias x 2 weeks associated with sore lymph nodes and fatigue. Denies fever, chills. Denies joint erythema, swelling or warmth. Denies history of arthritis. Has history of depression, hyperlipidemia, hypothyroidism and hypogonadism that is currently being treated by an Internist. Denies known tick bite or sick contact. Denies recent foreign travel. Denies rash, abdominal pain, nausea or vomiting. Is not currently sexually active. Denies concern for STI or hepatitis.   Past Medical History:  Diagnosis Date  . Anxiety   . Chest pain   . Depression   . Enlarged prostate   . Hyperlipidemia   . Hypogonadism male   . Hypothyroidism   . Plantar fibromatosis    right foot    Current Outpatient Prescriptions on File Prior to Visit  Medication Sig Dispense Refill  . aspirin EC 81 MG tablet Take 81 mg by mouth daily.    . Cholecalciferol (VITAMIN D-3) 1000 UNITS CAPS Take 2,000 Units by mouth daily.    Marland Kitchen levothyroxine (SYNTHROID, LEVOTHROID) 75 MCG tablet Take 1 tablet by mouth daily.    . Omega-3 Fatty Acids (FISH OIL) 1000 MG CAPS Take by mouth.    Marland Kitchen PARoxetine (PAXIL) 30 MG tablet Take 1 tablet by mouth daily.     No current facility-administered medications on file prior to visit.     No Known Allergies  Family History  Problem Relation Age of Onset  . Prostate cancer Father   . Anxiety disorder Mother   . Autoimmune disease Mother   . Colon cancer Maternal Grandmother   . Emphysema Maternal Grandfather   . Lung cancer Paternal Grandfather     Social History   Social History  . Marital status: Married    Spouse name: N/A  . Number of children: N/A  . Years of education: N/A   Social History Main Topics  . Smoking status: Never Smoker  . Smokeless tobacco: Never Used  . Alcohol use 1.0 - 2.0 oz/week    2 - 4 Standard drinks or equivalent per week     Comment: social  . Drug use: No  .  Sexual activity: Yes   Other Topics Concern  . None   Social History Narrative  . None   Review of Systems - See HPI.  All other ROS are negative.  BP 108/80   Pulse (!) 55   Temp 98.4 F (36.9 C) (Oral)   Resp 14   Ht 5' 11"  (1.803 m)   Wt 185 lb (83.9 kg)   SpO2 98%   BMI 25.80 kg/m   Physical Exam  Constitutional: He is oriented to person, place, and time and well-developed, well-nourished, and in no distress.  HENT:  Head: Normocephalic and atraumatic.  Eyes: Conjunctivae are normal.  Neck: Neck supple.  Cardiovascular: Normal rate, regular rhythm, normal heart sounds and intact distal pulses.   Pulmonary/Chest: Effort normal and breath sounds normal. No respiratory distress. He has no wheezes. He has no rales. He exhibits no tenderness.  Musculoskeletal:       Right knee: Normal.       Left knee: Normal.       Right ankle: Normal.       Left ankle: Normal.  Lymphadenopathy:       Head (right side): No submental, no submandibular, no tonsillar, no preauricular, no posterior auricular and no occipital adenopathy present.       Head (left side): No submental, no  submandibular, no tonsillar, no preauricular, no posterior auricular and no occipital adenopathy present.    He has no cervical adenopathy.       Right cervical: No superficial cervical, no deep cervical and no posterior cervical adenopathy present.      Left cervical: No superficial cervical, no deep cervical and no posterior cervical adenopathy present.    He has no axillary adenopathy.       Right axillary: No pectoral and no lateral adenopathy present.       Left axillary: No pectoral and no lateral adenopathy present.      Right: No inguinal and no supraclavicular adenopathy present.       Left: No inguinal and no supraclavicular adenopathy present.  Neurological: He is alert and oriented to person, place, and time. He has normal strength and intact cranial nerves. Gait normal.  Skin: Skin is warm and dry.  No rash noted.  Psychiatric: Affect normal.  Vitals reviewed.  Assessment/Plan: 1. Polyarthritis Unclear etiology. 2 weeks duration and improving. Suspect viral cause. Exam today is highly unremarkable. Will check lab panel today to include the following: - CBC w/Diff - Sed Rate (ESR) - TSH - [place] spotted fvr abs pnl(IgG+IgM) - B. burgdorfi antibodies; Future - B. burgdorfi antibodies Supportive measures reviewed with patient. Will alter regimen based on findings and course of symptoms.   Leeanne Rio, PA-C

## 2017-05-11 NOTE — Patient Instructions (Signed)
Please go to the lab for blood work. I will call you with your results. Stay well hydrated and get plenty of rest. Tylenol or Ibuprofen for aches.  Avoid heavy lifting or overexertion.  We will alter recommendations based on lab findings.

## 2017-05-12 ENCOUNTER — Telehealth: Payer: Self-pay | Admitting: Physician Assistant

## 2017-05-12 LAB — ROCKY MTN SPOTTED FVR ABS PNL(IGG+IGM)
RMSF IGM: NOT DETECTED
RMSF IgG: NOT DETECTED

## 2017-05-12 LAB — B. BURGDORFI ANTIBODIES: B burgdorferi Ab IgG+IgM: <0.9 index

## 2017-05-12 NOTE — Telephone Encounter (Signed)
Pt states that he would like a call back from [facility].

## 2017-05-13 ENCOUNTER — Other Ambulatory Visit: Payer: Self-pay | Admitting: Physician Assistant

## 2017-05-13 DIAGNOSIS — D582 Other hemoglobinopathies: Secondary | ICD-10-CM

## 2017-05-13 NOTE — Telephone Encounter (Signed)
Patient advised of lab results. Order placed for repeat cbc in 1-2 weeks. He is agreeable.

## 2017-06-30 IMAGING — US US THYROID
1 series · 13 of 25 positions shown · non-contrast
Comparison: None.

CLINICAL DATA: Multinodular goiter

EXAM:
THYROID ULTRASOUND
TECHNIQUE: Ultrasound examination of the thyroid gland and adjacent soft
tissues was performed.

[Series 1: us thyroid · 0.06mm/px · 13 of 54 slices shown]
[im 1/54]
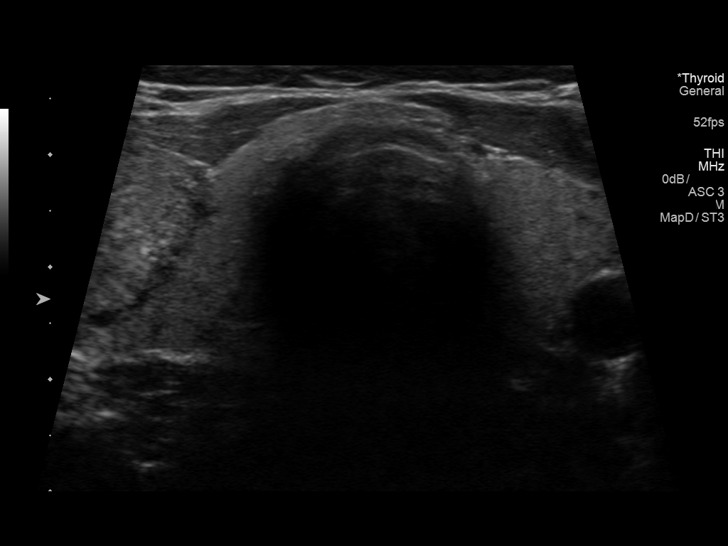
[im 5/54]
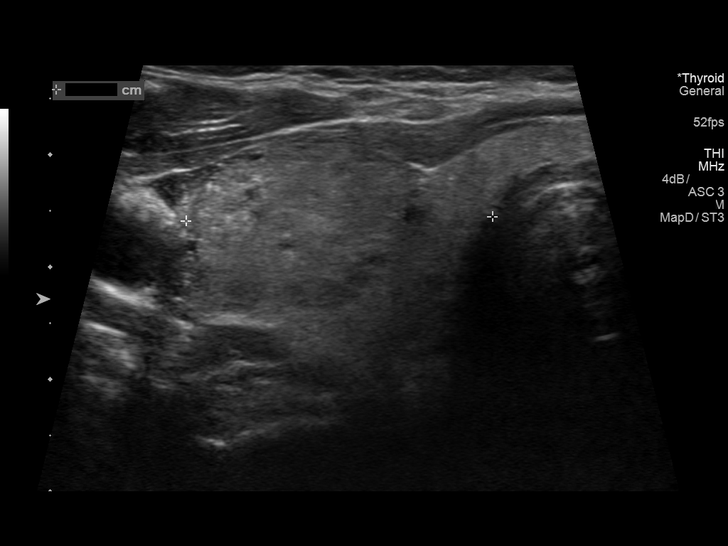
[im 9/54]
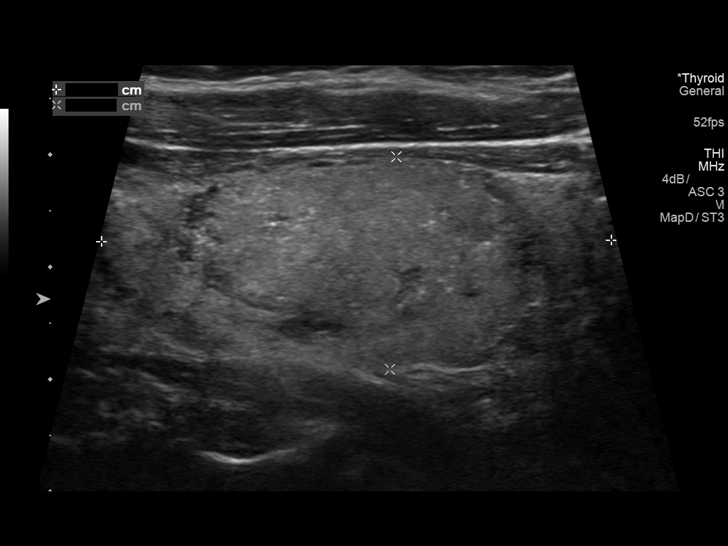
[im 14/54]
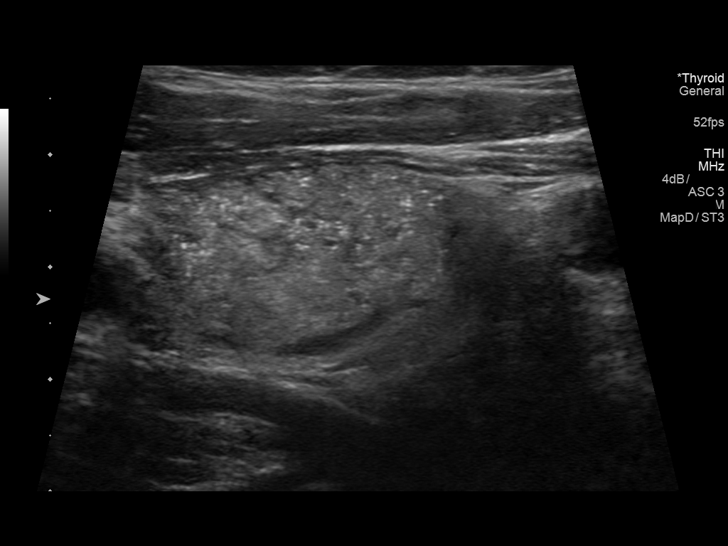
[im 18/54]
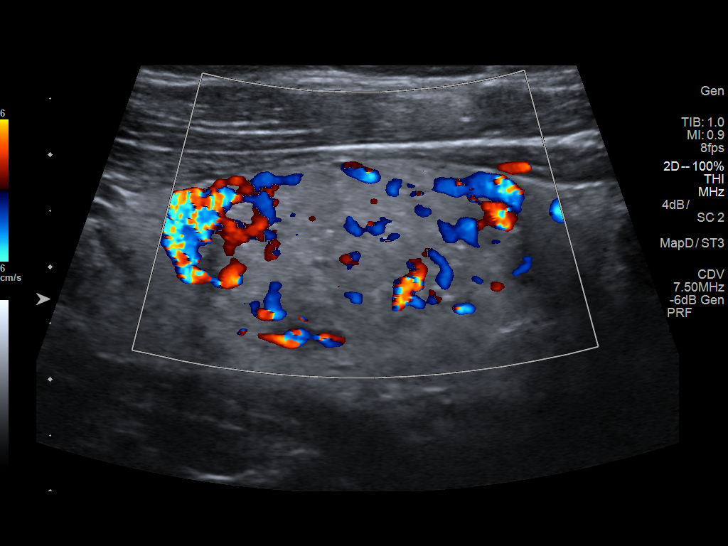
[im 23/54]
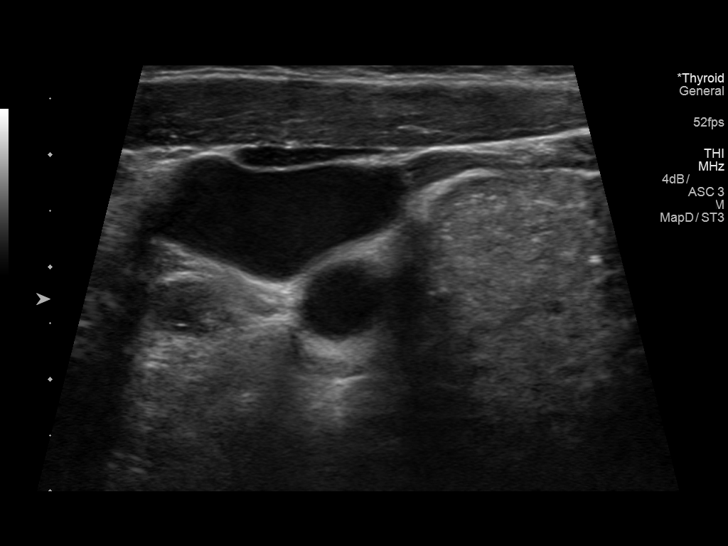
[im 27/54]
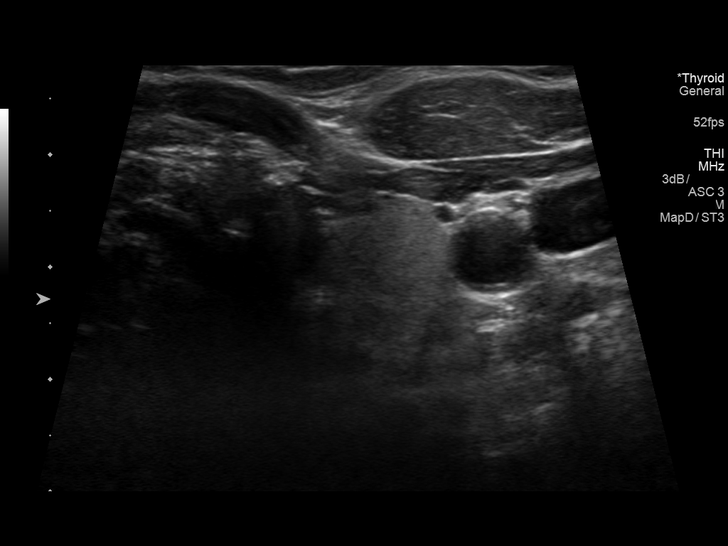
[im 31/54]
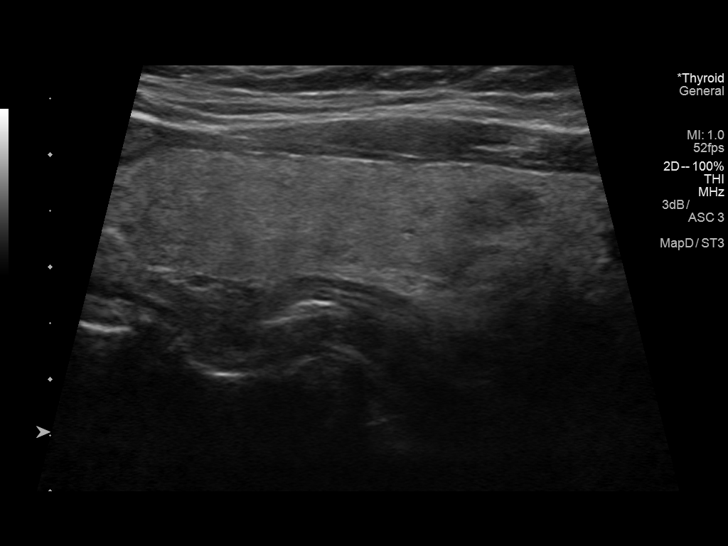
[im 36/54]
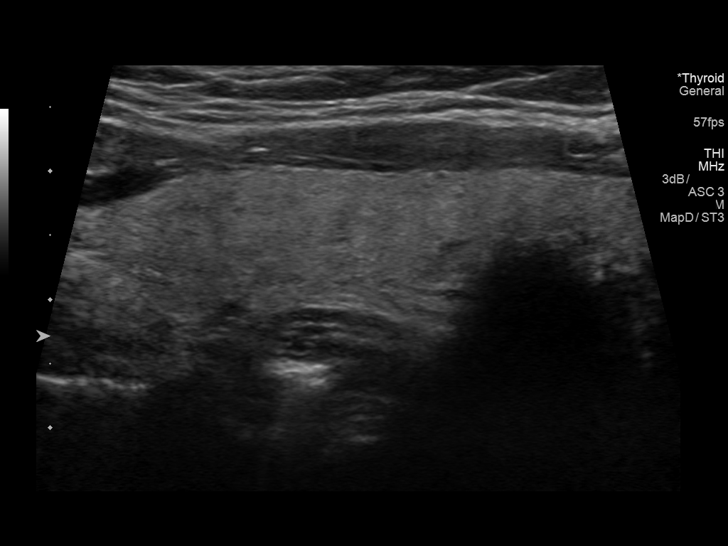
[im 40/54]
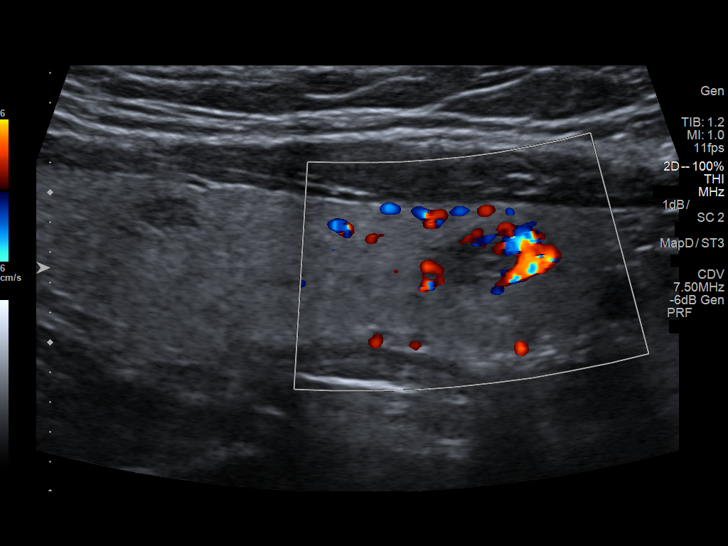
[im 45/54]
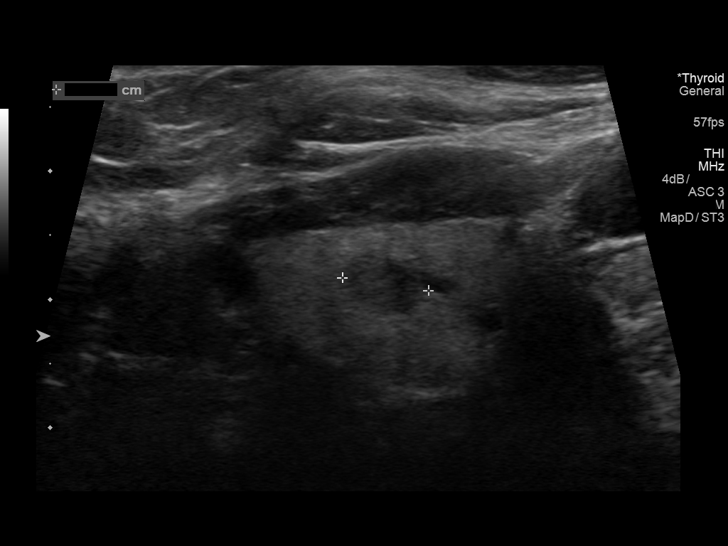
[im 49/54]
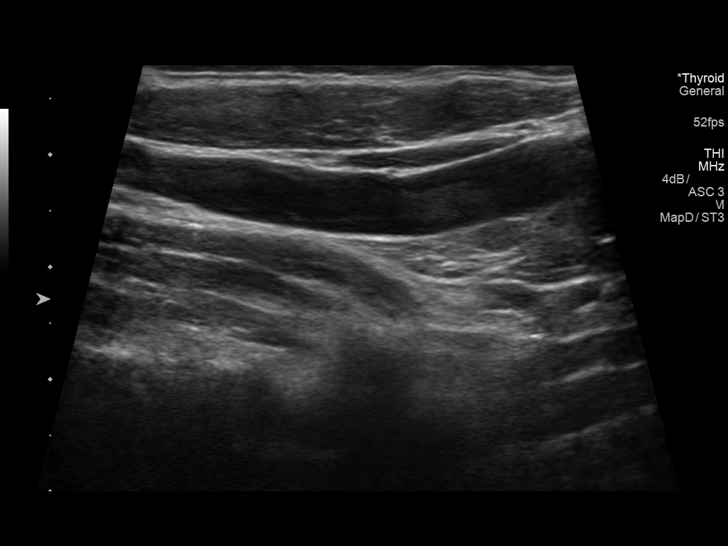
[im 54/54]
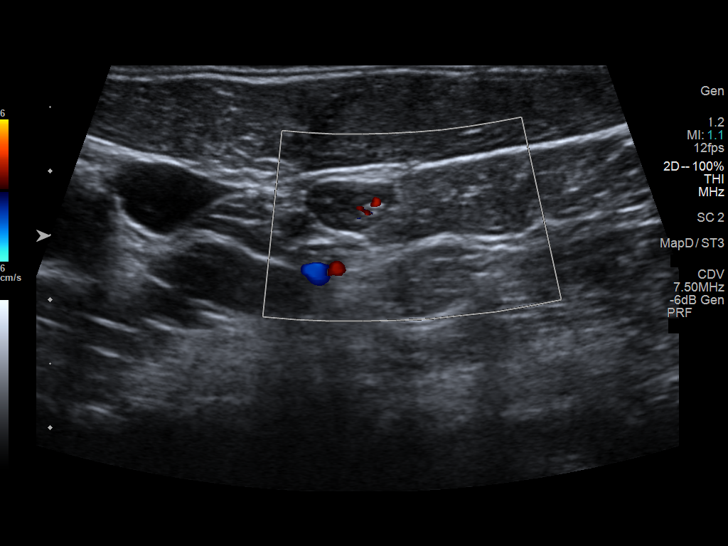

[13 of 25 positions shown; findings below may reference images not displayed]

FINDINGS: Parenchymal Echotexture: Mildly heterogenous

Isthmus: 0.4 cm thickness

Right lobe: 4.6 x 2.2 x 2.7 cm

Left lobe: 4.7 x 1.3 x 1.9 cm

_________________________________________________________

Estimated total number of nodules >/= 1 cm: 1

Number of spongiform nodules >/=  2 cm not described below (TR1): 0

Number of mixed cystic and solid nodules >/= 1.5 cm not described
below (TR2): 0

_________________________________________________________

Nodule # 1:

Location: Right; Mid

Maximum size: 3.1 cm; Other 2 dimensions: 1.6 x 2 cm

Composition: solid/almost completely solid (2)

Echogenicity: isoechoic (1)

Shape: not taller-than-wide (0)

Margins: ill-defined (0)

Echogenic foci: punctate echogenic foci (3)

ACR TI-RADS total points: 6.

ACR TI-RADS risk category: TR4 (4-6 points).

ACR TI-RADS recommendations:

**Given size (>/= 1.5 cm) and appearance, fine needle aspiration of
this moderately suspicious nodule should be considered based on
TI-RADS criteria.

_________________________________________________________

0.8 cm hypoechoic nodule, inferior left

Additional smaller scattered hypoechoic nodules less than 5 mm.
IMPRESSION: 1. Mild thyromegaly with moderately suspicious 3.1 cm right mid
nodule. Recommend FNA biopsy.

The above is in keeping with the ACR TI-RADS recommendations - [HOSPITAL] 3136;[DATE].

## 2018-01-04 ENCOUNTER — Other Ambulatory Visit: Payer: Self-pay | Admitting: Internal Medicine

## 2018-01-04 DIAGNOSIS — E042 Nontoxic multinodular goiter: Secondary | ICD-10-CM

## 2018-01-14 ENCOUNTER — Ambulatory Visit
Admission: RE | Admit: 2018-01-14 | Discharge: 2018-01-14 | Disposition: A | Discharge status: Home / Self Care | Payer: BLUE CROSS/BLUE SHIELD | Source: Ambulatory Visit | Attending: Internal Medicine | Admitting: Internal Medicine

## 2018-01-14 DIAGNOSIS — E042 Nontoxic multinodular goiter: Secondary | ICD-10-CM

## 2018-05-25 ENCOUNTER — Other Ambulatory Visit: Payer: Self-pay | Admitting: Plastic Surgery

## 2020-05-21 ENCOUNTER — Other Ambulatory Visit: Payer: Self-pay | Admitting: Orthopedic Surgery

## 2020-05-21 DIAGNOSIS — M67912 Unspecified disorder of synovium and tendon, left shoulder: Secondary | ICD-10-CM

## 2020-06-11 ENCOUNTER — Ambulatory Visit
Admission: RE | Admit: 2020-06-11 | Discharge: 2020-06-11 | Disposition: A | Discharge status: Home / Self Care | Payer: BC Managed Care – PPO | Source: Ambulatory Visit | Attending: Orthopedic Surgery | Admitting: Orthopedic Surgery

## 2020-06-11 ENCOUNTER — Other Ambulatory Visit: Payer: Self-pay

## 2020-06-11 DIAGNOSIS — M67912 Unspecified disorder of synovium and tendon, left shoulder: Secondary | ICD-10-CM

## 2021-02-05 ENCOUNTER — Other Ambulatory Visit: Payer: Self-pay | Admitting: Internal Medicine

## 2021-02-05 DIAGNOSIS — E042 Nontoxic multinodular goiter: Secondary | ICD-10-CM

## 2021-02-14 ENCOUNTER — Other Ambulatory Visit: Payer: BC Managed Care – PPO

## 2021-02-18 ENCOUNTER — Ambulatory Visit
Admission: RE | Admit: 2021-02-18 | Discharge: 2021-02-18 | Disposition: A | Discharge status: Home / Self Care | Payer: 59 | Source: Ambulatory Visit | Attending: Internal Medicine | Admitting: Internal Medicine

## 2021-02-18 DIAGNOSIS — E042 Nontoxic multinodular goiter: Secondary | ICD-10-CM

## 2021-02-24 ENCOUNTER — Observation Stay (HOSPITAL_COMMUNITY)
Admission: EM | Admit: 2021-02-24 | Discharge: 2021-02-25 | Disposition: A | Discharge status: Home / Self Care | Payer: 59 | Attending: Internal Medicine | Admitting: Internal Medicine

## 2021-02-24 ENCOUNTER — Other Ambulatory Visit: Payer: Self-pay

## 2021-02-24 ENCOUNTER — Emergency Department (HOSPITAL_COMMUNITY): Payer: 59

## 2021-02-24 ENCOUNTER — Observation Stay (HOSPITAL_BASED_OUTPATIENT_CLINIC_OR_DEPARTMENT_OTHER): Payer: 59

## 2021-02-24 ENCOUNTER — Encounter (HOSPITAL_COMMUNITY): Payer: Self-pay | Admitting: Emergency Medicine

## 2021-02-24 DIAGNOSIS — Z79899 Other long term (current) drug therapy: Secondary | ICD-10-CM | POA: Diagnosis not present

## 2021-02-24 DIAGNOSIS — Z20822 Contact with and (suspected) exposure to covid-19: Secondary | ICD-10-CM | POA: Diagnosis not present

## 2021-02-24 DIAGNOSIS — I4819 Other persistent atrial fibrillation: Secondary | ICD-10-CM

## 2021-02-24 DIAGNOSIS — I4891 Unspecified atrial fibrillation: Secondary | ICD-10-CM | POA: Diagnosis not present

## 2021-02-24 DIAGNOSIS — R55 Syncope and collapse: Secondary | ICD-10-CM | POA: Diagnosis not present

## 2021-02-24 DIAGNOSIS — W010XXA Fall on same level from slipping, tripping and stumbling without subsequent striking against object, initial encounter: Secondary | ICD-10-CM | POA: Insufficient documentation

## 2021-02-24 DIAGNOSIS — Z7982 Long term (current) use of aspirin: Secondary | ICD-10-CM | POA: Insufficient documentation

## 2021-02-24 DIAGNOSIS — S0990XA Unspecified injury of head, initial encounter: Secondary | ICD-10-CM

## 2021-02-24 DIAGNOSIS — S0101XA Laceration without foreign body of scalp, initial encounter: Principal | ICD-10-CM | POA: Insufficient documentation

## 2021-02-24 DIAGNOSIS — F1010 Alcohol abuse, uncomplicated: Secondary | ICD-10-CM | POA: Diagnosis not present

## 2021-02-24 DIAGNOSIS — Y92002 Bathroom of unspecified non-institutional (private) residence single-family (private) house as the place of occurrence of the external cause: Secondary | ICD-10-CM | POA: Insufficient documentation

## 2021-02-24 DIAGNOSIS — E039 Hypothyroidism, unspecified: Secondary | ICD-10-CM

## 2021-02-24 LAB — CBC WITH DIFFERENTIAL/PLATELET
Abs Immature Granulocytes: 0.03 10*3/uL (ref 0.00–0.07)
Basophils Absolute: 0.1 10*3/uL (ref 0.0–0.1)
Basophils Relative: 1 %
Eosinophils Absolute: 0.4 10*3/uL (ref 0.0–0.5)
Eosinophils Relative: 6 %
HCT: 47.1 % (ref 39.0–52.0)
Hemoglobin: 16 g/dL (ref 13.0–17.0)
Immature Granulocytes: 1 %
Lymphocytes Relative: 35 %
Lymphs Abs: 2.1 10*3/uL (ref 0.7–4.0)
MCH: 32.5 pg (ref 26.0–34.0)
MCHC: 34 g/dL (ref 30.0–36.0)
MCV: 95.7 fL (ref 80.0–100.0)
Monocytes Absolute: 0.6 10*3/uL (ref 0.1–1.0)
Monocytes Relative: 10 %
Neutro Abs: 2.9 10*3/uL (ref 1.7–7.7)
Neutrophils Relative %: 47 %
Platelets: 207 10*3/uL (ref 150–400)
RBC: 4.92 MIL/uL (ref 4.22–5.81)
RDW: 12.1 % (ref 11.5–15.5)
WBC: 6.1 10*3/uL (ref 4.0–10.5)
nRBC: 0 % (ref 0.0–0.2)

## 2021-02-24 LAB — ECHOCARDIOGRAM COMPLETE
Area-P 1/2: 3.89 cm2
Height: 70 in
P 1/2 time: 713 msec
Weight: 2880 oz

## 2021-02-24 LAB — RESP PANEL BY RT-PCR (FLU A&B, COVID) ARPGX2
Influenza A by PCR: NEGATIVE
Influenza B by PCR: NEGATIVE
SARS Coronavirus 2 by RT PCR: NEGATIVE

## 2021-02-24 LAB — URINALYSIS, COMPLETE (UACMP) WITH MICROSCOPIC
Bacteria, UA: NONE SEEN
Bilirubin Urine: NEGATIVE
Glucose, UA: NEGATIVE mg/dL
Hgb urine dipstick: NEGATIVE
Ketones, ur: NEGATIVE mg/dL
Leukocytes,Ua: NEGATIVE
Nitrite: NEGATIVE
Protein, ur: NEGATIVE mg/dL
Specific Gravity, Urine: 1.008 (ref 1.005–1.030)
pH: 6 (ref 5.0–8.0)

## 2021-02-24 LAB — CBG MONITORING, ED: Glucose-Capillary: 108 mg/dL — ABNORMAL HIGH (ref 70–99)

## 2021-02-24 LAB — MAGNESIUM: Magnesium: 2 mg/dL (ref 1.7–2.4)

## 2021-02-24 LAB — BASIC METABOLIC PANEL
Anion gap: 9 (ref 5–15)
BUN: 18 mg/dL (ref 8–23)
CO2: 23 mmol/L (ref 22–32)
Calcium: 8.7 mg/dL — ABNORMAL LOW (ref 8.9–10.3)
Chloride: 110 mmol/L (ref 98–111)
Creatinine, Ser: 1.16 mg/dL (ref 0.61–1.24)
GFR, Estimated: >60 mL/min (ref >60)
Glucose, Bld: 100 mg/dL — ABNORMAL HIGH (ref 70–99)
Potassium: 3.9 mmol/L (ref 3.5–5.1)
Sodium: 142 mmol/L (ref 135–145)

## 2021-02-24 LAB — D-DIMER, QUANTITATIVE: D-Dimer, Quant: 0.29 ug/mL-FEU (ref 0.00–0.50)

## 2021-02-24 LAB — TROPONIN I (HIGH SENSITIVITY)
Troponin I (High Sensitivity): 8 ng/L (ref <18)
Troponin I (High Sensitivity): 19 ng/L — ABNORMAL HIGH (ref <18)

## 2021-02-24 LAB — RAPID URINE DRUG SCREEN, HOSP PERFORMED
Amphetamines: NOT DETECTED
Barbiturates: NOT DETECTED
Benzodiazepines: NOT DETECTED
Cocaine: NOT DETECTED
Opiates: NOT DETECTED
Tetrahydrocannabinol: NOT DETECTED

## 2021-02-24 LAB — TSH: TSH: 2.381 u[IU]/mL (ref 0.350–4.500)

## 2021-02-24 LAB — MRSA NEXT GEN BY PCR, NASAL: MRSA by PCR Next Gen: NOT DETECTED

## 2021-02-24 LAB — HIV ANTIBODY (ROUTINE TESTING W REFLEX): HIV Screen 4th Generation wRfx: NONREACTIVE

## 2021-02-24 MED ORDER — PAROXETINE HCL 30 MG PO TABS
30.0000 mg | ORAL_TABLET | Freq: Every day | ORAL | Status: DC
  Administered 2021-02-24 – 2021-02-25 (×2): 30 mg via ORAL
  Filled 2021-02-24 (×2): qty 1

## 2021-02-24 MED ORDER — THIAMINE HCL 100 MG/ML IJ SOLN: 100.0000 mg | Freq: Every day | INTRAMUSCULAR | Status: DC

## 2021-02-24 MED ORDER — ACETAMINOPHEN 500 MG PO TABS
1000.0000 mg | ORAL_TABLET | Freq: Once | ORAL | Status: AC
  Administered 2021-02-24: 1000 mg via ORAL
  Filled 2021-02-24: qty 2

## 2021-02-24 MED ORDER — LORAZEPAM 1 MG PO TABS
1.0000 mg | ORAL_TABLET | ORAL | Status: DC | PRN
  Administered 2021-02-24: 1 mg via ORAL
  Filled 2021-02-24 (×2): qty 1

## 2021-02-24 MED ORDER — DILTIAZEM HCL-DEXTROSE 125-5 MG/125ML-% IV SOLN (PREMIX)
5.0000 mg/h | INTRAVENOUS | Status: DC
  Administered 2021-02-24 (×2): 5 mg/h via INTRAVENOUS
  Filled 2021-02-24 (×3): qty 125

## 2021-02-24 MED ORDER — LEVOTHYROXINE SODIUM 75 MCG PO TABS
75.0000 ug | ORAL_TABLET | Freq: Every day | ORAL | Status: DC
  Administered 2021-02-24 – 2021-02-25 (×2): 75 ug via ORAL
  Filled 2021-02-24 (×2): qty 1

## 2021-02-24 MED ORDER — FOLIC ACID 1 MG PO TABS
1.0000 mg | ORAL_TABLET | Freq: Every day | ORAL | Status: DC
  Administered 2021-02-24 – 2021-02-25 (×2): 1 mg via ORAL
  Filled 2021-02-24 (×2): qty 1

## 2021-02-24 MED ORDER — CLONAZEPAM 0.25 MG PO TBDP
0.2500 mg | ORAL_TABLET | Freq: Two times a day (BID) | ORAL | Status: DC
  Administered 2021-02-24 – 2021-02-25 (×3): 0.25 mg via ORAL
  Filled 2021-02-24 (×3): qty 1

## 2021-02-24 MED ORDER — DILTIAZEM LOAD VIA INFUSION
10.0000 mg | Freq: Once | INTRAVENOUS | Status: AC
  Administered 2021-02-24: 10 mg via INTRAVENOUS
  Filled 2021-02-24: qty 10

## 2021-02-24 MED ORDER — THIAMINE HCL 100 MG PO TABS
100.0000 mg | ORAL_TABLET | Freq: Every day | ORAL | Status: DC
  Administered 2021-02-24 – 2021-02-25 (×2): 100 mg via ORAL
  Filled 2021-02-24 (×2): qty 1

## 2021-02-24 MED ORDER — ENOXAPARIN SODIUM 40 MG/0.4ML IJ SOSY
40.0000 mg | PREFILLED_SYRINGE | Freq: Every day | INTRAMUSCULAR | Status: DC
  Administered 2021-02-24: 40 mg via SUBCUTANEOUS
  Filled 2021-02-24: qty 0.4

## 2021-02-24 MED ORDER — TAMSULOSIN HCL 0.4 MG PO CAPS
0.4000 mg | ORAL_CAPSULE | Freq: Every day | ORAL | Status: DC
  Administered 2021-02-24 – 2021-02-25 (×2): 0.4 mg via ORAL
  Filled 2021-02-24 (×2): qty 1

## 2021-02-24 MED ORDER — LORAZEPAM 2 MG/ML IJ SOLN
1.0000 mg | INTRAMUSCULAR | Status: DC | PRN
Start: 2021-02-24 — End: 2021-02-25

## 2021-02-24 MED ORDER — ONDANSETRON HCL 4 MG/2ML IJ SOLN: 4.0000 mg | Freq: Four times a day (QID) | INTRAMUSCULAR | Status: DC | PRN

## 2021-02-24 MED ORDER — SODIUM CHLORIDE 0.9 % IV BOLUS
500.0000 mL | Freq: Once | INTRAVENOUS | Status: AC
  Administered 2021-02-24: 500 mL via INTRAVENOUS

## 2021-02-24 MED ORDER — ADULT MULTIVITAMIN W/MINERALS CH
1.0000 | ORAL_TABLET | Freq: Every day | ORAL | Status: DC
  Administered 2021-02-24 – 2021-02-25 (×2): 1 via ORAL
  Filled 2021-02-24 (×2): qty 1

## 2021-02-24 MED ORDER — ACETAMINOPHEN 325 MG PO TABS
650.0000 mg | ORAL_TABLET | ORAL | Status: DC | PRN
  Administered 2021-02-24: 650 mg via ORAL
  Filled 2021-02-24: qty 2

## 2021-02-24 MED ORDER — APIXABAN 5 MG PO TABS
5.0000 mg | ORAL_TABLET | Freq: Two times a day (BID) | ORAL | Status: DC
  Administered 2021-02-24 – 2021-02-25 (×3): 5 mg via ORAL
  Filled 2021-02-24 (×3): qty 1

## 2021-02-24 NOTE — Progress Notes (Addendum)
Patient's HR still noted to be in the low 50s with cardizem gtt running at 5mg /hr. HR as low as 48 at times. Dr. Juliane Lack paged again regarding this. Verbal order to d/c gtt.

## 2021-02-24 NOTE — Progress Notes (Signed)
Patient's HR noted to be in the low 50s while on cardizem gtt at 15mg /hr. Dr. Juliane Lack with Cardiology paged regarding this. Verbal order to decrease gtt to 10mg /hr. Decrease to 5mg /hr if HR continues to be in the 50s. VSS and patient asymptomatic. Will continue to monitor the patient.

## 2021-02-24 NOTE — Plan of Care (Signed)
  Problem: Education: Goal: Knowledge of General Education information will improve Description: Including pain rating scale, medication(s)/side effects and non-pharmacologic comfort measures Outcome: Progressing   Problem: Health Behavior/Discharge Planning: Goal: Ability to manage health-related needs will improve Outcome: Progressing   Problem: Clinical Measurements: Goal: Ability to maintain clinical measurements within normal limits will improve Outcome: Progressing   Problem: Clinical Measurements: Goal: Diagnostic test results will improve Outcome: Progressing   Problem: Clinical Measurements: Goal: Cardiovascular complication will be avoided Outcome: Progressing   Problem: Activity: Goal: Risk for activity intolerance will decrease Outcome: Progressing   Problem: Nutrition: Goal: Adequate nutrition will be maintained Outcome: Progressing   Problem: Pain Managment: Goal: General experience of comfort will improve Outcome: Progressing   Problem: Safety: Goal: Ability to remain free from injury will improve Outcome: Progressing

## 2021-02-24 NOTE — Consult Note (Signed)
Cardiology Consultation:   Patient ID: Dylan Hale MRN: [id number]; DOB: 04-13-1951  Admit date: 02/24/2021 Date of Consult: 02/24/2021  PCP:  Bayard Beaver., MD   [facility] Providers Cardiologist:  New   Patient Profile:   Dylan Hale is a 70 y.o. male with a hx of hyperlipidemia, hypothyroidism, alcohol abuse who is being seen 02/24/2021 for the evaluation of atrial fibrillation at the request of Aileen Fass, Tammi Klippel MD.  History of Present Illness:   Patient seen previously by Dr. Alvester Chou but not since 2014.  At that time he was seen for chest pain.  Echocardiogram showed ejection fraction 60 to 65%, mild aortic insufficiency, mild mitral regurgitation.  Nuclear study showed ejection fraction 53% and normal perfusion.  Patient states he has been consuming excess alcohol in the past 2 weeks.  He had shots and beer last evening.  He came home and went to bed.  After an hour he states he got up to go to the bathroom.  He fell for unclear reasons (unclear if he had frank syncope).  He hit his head and decided to come to the emergency room.  Note he was not having dyspnea, chest pain, palpitations.  He was found to be in atrial fibrillation with rapid ventricular response and cardiology asked to evaluate.   Past Medical History:  Diagnosis Date   Anxiety    Chest pain    Depression    Enlarged prostate    Hyperlipidemia    Hypogonadism male    Hypothyroidism    Plantar fibromatosis    right foot    Past Surgical History:  Procedure Laterality Date   MASS EXCISION Right 08/02/2015   Procedure: EXCISION OF RIGHT FOOT MASS;  Surgeon: Wylene Simmer, MD;  Location: [place];  Service: Orthopedics;  Laterality: Right;   SHOULDER SURGERY Left    Arthroscopic     Inpatient Medications: Scheduled Meds:  clonazePAM  0.25 mg Oral BID   enoxaparin (LOVENOX) injection  40 mg Subcutaneous Daily   folic acid  1 mg Oral Daily   levothyroxine  75 mcg Oral Q0600    multivitamin with minerals  1 tablet Oral Daily   PARoxetine  30 mg Oral Daily   tamsulosin  0.4 mg Oral Daily   thiamine  100 mg Oral Daily   Or   thiamine  100 mg Intravenous Daily   Continuous Infusions:  diltiazem (CARDIZEM) infusion 15 mg/hr (02/24/21 1004)   PRN Meds: acetaminophen, LORazepam **OR** LORazepam, ondansetron (ZOFRAN) IV  Allergies:    Allergies  Allergen Reactions   Sudafed [Pseudoephedrine] Other (See Comments)    jitters    Social History:   Social History   Socioeconomic History   Marital status: Married    Spouse name: Not on file   Number of children: 4   Years of education: Not on file   Highest education level: Not on file  Occupational History   Not on file  Tobacco Use   Smoking status: Never   Smokeless tobacco: Never  Vaping Use   Vaping Use: Never used  Substance and Sexual Activity   Alcohol use: Yes    Alcohol/week: 2.0 - 4.0 standard drinks    Types: 2 - 4 Standard drinks or equivalent per week   Drug use: No   Sexual activity: Yes  Other Topics Concern   Not on file  Social History Narrative   Not on file   Social Determinants of Health   Financial Resource  Strain: Not on file  Food Insecurity: Not on file  Transportation Needs: Not on file  Physical Activity: Not on file  Stress: Not on file  Social Connections: Not on file  Intimate Partner Violence: Not on file    Family History:    Family History  Problem Relation Age of Onset   Prostate cancer Father    Anxiety disorder Mother    Autoimmune disease Mother    Colon cancer Maternal Grandmother    Emphysema Maternal Grandfather    Lung cancer Paternal Grandfather      ROS:  Please see the history of present illness.  Patient denies fevers, chills, productive cough, hemoptysis. All other ROS reviewed and negative.     Physical Exam/Data:   Vitals:   02/24/21 0645 02/24/21 0730 02/24/21 0800 02/24/21 0930  BP: (!) 114/92 116/73 106/69 (!) 140/92   Pulse: (!) 58 79 71 77  Resp: (!) 24 16 16 13   Temp:      TempSrc:      SpO2: 98% 95% 96% 97%  Weight:      Height:        Intake/Output Summary (Last 24 hours) at 02/24/2021 1011 Last data filed at 02/24/2021 0830 Gross per 24 hour  Intake 536.54 ml  Output 1400 ml  Net -863.46 ml   Last 3 Weights 02/24/2021 05/11/2017 08/02/2015  Weight (lbs) 180 lb 185 lb 179 lb  Weight (kg) 81.647 kg 83.915 kg 81.194 kg     Body mass index is 25.83 kg/m.  General:  Well nourished, well developed, in no acute distress HEENT: normal Lymph: no adenopathy Neck: no JVD Endocrine:  No thryomegaly Vascular: No carotid bruits; FA pulses 2+ bilaterally without bruits  Cardiac:  normal S1, S2; irregular and tachycardic, no murmur Lungs:  clear to auscultation bilaterally, no wheezing, rhonchi or rales  Abd: soft, nontender, no hepatomegaly  Ext: no edema Musculoskeletal:  No deformities, BUE and BLE strength normal and equal Skin: warm and dry  Neuro:  CNs 2-12 intact, no focal abnormalities noted Psych:  Normal affect   EKG:  The EKG was personally reviewed and demonstrates: Atrial fibrillation with rapid ventricular response  Laboratory Data:  High Sensitivity Troponin:   Recent Labs  Lab 02/24/21 0220 02/24/21 0430  TROPONINIHS 8 19*     Chemistry Recent Labs  Lab 02/24/21 0220  NA 142  K 3.9  CL 110  CO2 23  GLUCOSE 100*  BUN 18  CREATININE 1.16  CALCIUM 8.7*  GFRNONAA >60  ANIONGAP 9     Hematology Recent Labs  Lab 02/24/21 0220  WBC 6.1  RBC 4.92  HGB 16.0  HCT 47.1  MCV 95.7  MCH 32.5  MCHC 34.0  RDW 12.1  PLT 207   DDimer  Recent Labs  Lab 02/24/21 0639  DDIMER 0.29     Radiology/Studies:  CT HEAD WO CONTRAST  Result Date: 02/24/2021 CLINICAL DATA:  Head and neck trauma. EXAM: CT HEAD WITHOUT CONTRAST CT CERVICAL SPINE WITHOUT CONTRAST TECHNIQUE: Multidetector CT imaging of the head and cervical spine was performed following the standard protocol  without intravenous contrast. Multiplanar CT image reconstructions of the cervical spine were also generated. COMPARISON:  None. FINDINGS: CT HEAD FINDINGS Brain: No evidence of acute infarction, hemorrhage, hydrocephalus, extra-axial collection or mass lesion/mass effect. Mild cerebral atrophy. Vascular: No hyperdense vessel or unexpected calcification. Skull: Calvarium appears intact. Sinuses/Orbits: Mucosal thickening in the paranasal sinuses. No acute air-fluid levels. Mastoid air cells are clear. Other:  None. CT CERVICAL SPINE FINDINGS Alignment: Straightening of usual cervical lordosis without anterior subluxation. This is likely positional but could indicate muscle spasm. Skull base and vertebrae: Skull base appears intact. No vertebral compression deformities. No focal bone lesion or bone destruction. Soft tissues and spinal canal: No prevertebral soft tissue swelling. No abnormal paraspinal soft tissue mass or infiltration. Disc levels: Degenerative changes with narrowed disc spaces and endplate osteophyte formation throughout. Degenerative changes in the facet joints. Uncovertebral spurring causes bone encroachment upon bilateral neural foramina. Upper chest: Lung apices are clear. Other: None. IMPRESSION: 1. No acute intracranial abnormalities. 2. Nonspecific straightening of usual cervical lordosis. No acute displaced fractures identified. Degenerative changes. Electronically Signed   By: Lucienne Capers M.D.   On: 02/24/2021 03:42   CT CERVICAL SPINE WO CONTRAST  Result Date: 02/24/2021 CLINICAL DATA:  Head and neck trauma. EXAM: CT HEAD WITHOUT CONTRAST CT CERVICAL SPINE WITHOUT CONTRAST TECHNIQUE: Multidetector CT imaging of the head and cervical spine was performed following the standard protocol without intravenous contrast. Multiplanar CT image reconstructions of the cervical spine were also generated. COMPARISON:  None. FINDINGS: CT HEAD FINDINGS Brain: No evidence of acute infarction,  hemorrhage, hydrocephalus, extra-axial collection or mass lesion/mass effect. Mild cerebral atrophy. Vascular: No hyperdense vessel or unexpected calcification. Skull: Calvarium appears intact. Sinuses/Orbits: Mucosal thickening in the paranasal sinuses. No acute air-fluid levels. Mastoid air cells are clear. Other: None. CT CERVICAL SPINE FINDINGS Alignment: Straightening of usual cervical lordosis without anterior subluxation. This is likely positional but could indicate muscle spasm. Skull base and vertebrae: Skull base appears intact. No vertebral compression deformities. No focal bone lesion or bone destruction. Soft tissues and spinal canal: No prevertebral soft tissue swelling. No abnormal paraspinal soft tissue mass or infiltration. Disc levels: Degenerative changes with narrowed disc spaces and endplate osteophyte formation throughout. Degenerative changes in the facet joints. Uncovertebral spurring causes bone encroachment upon bilateral neural foramina. Upper chest: Lung apices are clear. Other: None. IMPRESSION: 1. No acute intracranial abnormalities. 2. Nonspecific straightening of usual cervical lordosis. No acute displaced fractures identified. Degenerative changes. Electronically Signed   By: Lucienne Capers M.D.   On: 02/24/2021 03:42   DG Chest Port 1 View  Result Date: 02/24/2021 CLINICAL DATA:  Fall EXAM: PORTABLE CHEST 1 VIEW COMPARISON:  06/20/2013 FINDINGS: The heart size and mediastinal contours are within normal limits. Both lungs are clear. The visualized skeletal structures are unremarkable. IMPRESSION: No active disease. Electronically Signed   By: Ulyses Jarred M.D.   On: 02/24/2021 02:57     Assessment and Plan:   Persistent atrial fibrillation-atrial fibrillation is likely secondary to alcohol abuse.  We will arrange echocardiogram to assess LV function.  Check TSH for completeness.  Continue IV Cardizem for rate control. CHADSvasc 1 for age greater than 32.  Add apixaban 5 mg  twice daily.  Hopefully he will convert on his own.  If not we will plan rate control with follow-up in the office in 4 to 6 weeks.  If atrial fibrillation persists we will plan cardioversion at that time.  If rate is difficult to control can pursue TEE guided cardioversion prior to discharge. Alcohol abuse-I discussed with patient that alcohol likely contributed to atrial fibrillation.  We discussed the importance of avoiding. Question syncope-patient unclear as to why he fell in early a.m. hours.  However it sounds as though he was intoxicated.  Will await echocardiogram to reassess LV function.  If normal will not pursue further evaluation unless he has  recurrences in the future. Anxiety-Per primary service. Hypothyroidism-continue Synthroid.   Risk Assessment/Risk Scores:    CHA2DS2-VASc Score = 1  This indicates a 0.6% annual risk of stroke. The patient's score is based upon: CHF History: No HTN History: No Diabetes History: No Stroke History: No Vascular Disease History: No Age Score: 1 Gender Score: 0          For questions or updates, please contact [place] Please consult [email or web address] for contact info under    Signed, Kirk Ruths, MD  02/24/2021 10:11 AM

## 2021-02-24 NOTE — ED Notes (Signed)
Offered pt ativan per [facility]WA protocol, pt wanting to hold off at this time and try taking his klonopin, this RN called main pharmacy to get klonopin sent to ED as it is not stocked in the pyxis

## 2021-02-24 NOTE — H&P (Signed)
History and Physical    Dylan Hale ZOX:[id number] DOB: 06-06-1951 DOA: 02/24/2021  PCP: Patient, No Pcp Per (Inactive)  Patient coming from: Home   Chief Complaint:  Chief Complaint  Patient presents with   Fall     HPI:    70 year old male with past medical history of hypothyroidism, generalized anxiety disorder and hyperlipidemia who presents to [facility] emergency department status post fall.   The patient admits to drinking at least 3-4 beers daily particularly in the past several weeks.  Patient states that yesterday evening he went out drinking at a bar and drank at least 4 beers in addition to drinking at least 3 shots of liquor.  Patient went home that evening and explains that at approximately 1215 this morning patient awoke from sleep to use the restroom.  Patient walked into the restroom and shortly after entering suddenly lost consciousness.  This resulted in the patient falling backwards into the shower door shattering it and injuring the back of his scalp.  Patient immediately regained consciousness and due to the bleeding scalp laceration patient decided to present to [facility] emergency department for evaluation.  Upon further questioning patient reports that he has been experiencing intermittent palpitations for "a very long time."  Patient denies any recent fevers, nausea, vomiting, shortness of breath, sick contacts, recent travel or confirmed contact with COVID-19 infection.  Patient denies any illicit drug use.  Upon evaluation in the emergency department patient was found to be in rapid atrial fibrillation with initial heart rates near 130 bpm.  Patient was initiated on an intravenous Cardizem infusion with some improvement in heart rate.  Troponins were found to be 8 and 19.  No significant electrolyte derangement was noted.  The hospitalist group was then called to assess the patient for admission to the hospital.   Review of Systems:    Review of Systems  Cardiovascular:  Positive for palpitations.  Musculoskeletal:  Positive for falls.  Neurological:  Positive for loss of consciousness.  All other systems reviewed and are negative.  Past Medical History:  Diagnosis Date   Anxiety    Chest pain    Depression    Enlarged prostate    Hyperlipidemia    Hypogonadism male    Hypothyroidism    Plantar fibromatosis    right foot    Past Surgical History:  Procedure Laterality Date   MASS EXCISION Right 08/02/2015   Procedure: EXCISION OF RIGHT FOOT MASS;  Surgeon: Wylene Simmer, MD;  Location: [facility];  Service: Orthopedics;  Laterality: Right;     reports that he has never smoked. He has never used smokeless tobacco. He reports current alcohol use of about 2.0 - 4.0 standard drinks of alcohol per week. He reports that he does not use drugs.  Allergies  Allergen Reactions   Sudafed [Pseudoephedrine] Other (See Comments)    jitters    Family History  Problem Relation Age of Onset   Prostate cancer Father    Anxiety disorder Mother    Autoimmune disease Mother    Colon cancer Maternal Grandmother    Emphysema Maternal Grandfather    Lung cancer Paternal Grandfather      Prior to Admission medications   Medication Sig Start Date End Date Taking? Authorizing Provider  aspirin EC 81 MG tablet Take 81 mg by mouth daily.    [provider]  Cholecalciferol (VITAMIN D-3) 1000 UNITS CAPS Take 2,000 Units by mouth daily.    [provider]  levothyroxine (SYNTHROID, LEVOTHROID) 75 MCG tablet Take 1 tablet by mouth daily. 03/18/13   [provider]  Omega-3 Fatty Acids (FISH OIL) 1000 MG CAPS Take by mouth.    [provider]  PARoxetine (PAXIL) 30 MG tablet Take 1 tablet by mouth daily. 03/18/13   [provider]  Testosterone 30 MG/ACT SOLN APPLY 2 PUMPS TO EACH UNDERARM EVERY MORNING AFTER SHOWER 03/11/17   [provider]    Physical  Exam: Vitals:   02/24/21 0545 02/24/21 0615 02/24/21 0630 02/24/21 0645  BP: 115/89 111/87 111/78 (!) 114/92  Pulse: (!) 45 (!) 27 80 (!) 58  Resp: 18 (!) 7 15 (!) 24  Temp:      TempSrc:      SpO2: 97% 99% 96% 98%  Weight:      Height:        Constitutional: Awake alert and oriented x3, no associated distress.   Skin: Superficial abrasion noted on the posterior scalp.  Otherwise, no rashes, no lesions, good skin turgor noted. Eyes: Pupils are equally reactive to light.  No evidence of scleral icterus or conjunctival pallor.  ENMT: Moist mucous membranes noted.  Posterior pharynx clear of any exudate or lesions.   Neck: normal, supple, no masses, no thyromegaly.  No evidence of jugular venous distension.   Respiratory: clear to auscultation bilaterally, no wheezing, no crackles. Normal respiratory effort. No accessory muscle use.  Cardiovascular: Tachycardic rate with irregularly irregular rhythm, no murmurs / rubs / gallops. No extremity edema. 2+ pedal pulses. No carotid bruits.  Chest:   Nontender without crepitus or deformity.   Back:   Nontender without crepitus or deformity. Abdomen: Abdomen is soft and nontender.  No evidence of intra-abdominal masses.  Positive bowel sounds noted in all quadrants.   Musculoskeletal: No joint deformity upper and lower extremities. Good ROM, no contractures. Normal muscle tone.  Neurologic: CN 2-12 grossly intact. Sensation intact.  Patient moving all 4 extremities spontaneously.  Patient is following all commands.  Patient is responsive to verbal stimuli.   Psychiatric: Patient exhibits anxious mood with appropriate affect.  Patient seems to possess insight as to their current situation.     Labs on Admission: I have personally reviewed following labs and imaging studies -   CBC: Recent Labs  Lab 02/24/21 0220  WBC 6.1  NEUTROABS 2.9  HGB 16.0  HCT 47.1  MCV 95.7  PLT 097   Basic Metabolic Panel: Recent Labs  Lab 02/24/21 0220  NA  142  K 3.9  CL 110  CO2 23  GLUCOSE 100*  BUN 18  CREATININE 1.16  CALCIUM 8.7*   GFR: Estimated Creatinine Clearance: 62.1 mL/min (by C-G formula based on SCr of 1.16 mg/dL). Liver Function Tests: No results for input(s): AST, ALT, ALKPHOS, BILITOT, PROT, ALBUMIN in the last 168 hours. No results for input(s): LIPASE, AMYLASE in the last 168 hours. No results for input(s): AMMONIA in the last 168 hours. Coagulation Profile: No results for input(s): INR, PROTIME in the last 168 hours. Cardiac Enzymes: No results for input(s): CKTOTAL, CKMB, CKMBINDEX, TROPONINI in the last 168 hours. BNP (last 3 results) No results for input(s): PROBNP in the last 8760 hours. HbA1C: No results for input(s): HGBA1C in the last 72 hours. CBG: Recent Labs  Lab 02/24/21 0147  GLUCAP 108*   Lipid Profile: No results for input(s): CHOL, HDL, LDLCALC, TRIG, CHOLHDL, LDLDIRECT in the last 72 hours. Thyroid Function Tests: No results for input(s): TSH,  T4TOTAL, FREET4, T3FREE, THYROIDAB in the last 72 hours. Anemia Panel: No results for input(s): VITAMINB12, FOLATE, FERRITIN, TIBC, IRON, RETICCTPCT in the last 72 hours. Urine analysis: No results found for: COLORURINE, APPEARANCEUR, LABSPEC, [place], GLUCOSEU, HGBUR, BILIRUBINUR, KETONESUR, PROTEINUR, UROBILINOGEN, NITRITE, LEUKOCYTESUR  Radiological Exams on Admission - Personally Reviewed: CT HEAD WO CONTRAST  Result Date: 02/24/2021 CLINICAL DATA:  Head and neck trauma. EXAM: CT HEAD WITHOUT CONTRAST CT CERVICAL SPINE WITHOUT CONTRAST TECHNIQUE: Multidetector CT imaging of the head and cervical spine was performed following the standard protocol without intravenous contrast. Multiplanar CT image reconstructions of the cervical spine were also generated. COMPARISON:  None. FINDINGS: CT HEAD FINDINGS Brain: No evidence of acute infarction, hemorrhage, hydrocephalus, extra-axial collection or mass lesion/mass effect. Mild cerebral atrophy. Vascular: No  hyperdense vessel or unexpected calcification. Skull: Calvarium appears intact. Sinuses/Orbits: Mucosal thickening in the paranasal sinuses. No acute air-fluid levels. Mastoid air cells are clear. Other: None. CT CERVICAL SPINE FINDINGS Alignment: Straightening of usual cervical lordosis without anterior subluxation. This is likely positional but could indicate muscle spasm. Skull base and vertebrae: Skull base appears intact. No vertebral compression deformities. No focal bone lesion or bone destruction. Soft tissues and spinal canal: No prevertebral soft tissue swelling. No abnormal paraspinal soft tissue mass or infiltration. Disc levels: Degenerative changes with narrowed disc spaces and endplate osteophyte formation throughout. Degenerative changes in the facet joints. Uncovertebral spurring causes bone encroachment upon bilateral neural foramina. Upper chest: Lung apices are clear. Other: None. IMPRESSION: 1. No acute intracranial abnormalities. 2. Nonspecific straightening of usual cervical lordosis. No acute displaced fractures identified. Degenerative changes. Electronically Signed   By: Lucienne Capers M.D.   On: 02/24/2021 03:42   CT CERVICAL SPINE WO CONTRAST  Result Date: 02/24/2021 CLINICAL DATA:  Head and neck trauma. EXAM: CT HEAD WITHOUT CONTRAST CT CERVICAL SPINE WITHOUT CONTRAST TECHNIQUE: Multidetector CT imaging of the head and cervical spine was performed following the standard protocol without intravenous contrast. Multiplanar CT image reconstructions of the cervical spine were also generated. COMPARISON:  None. FINDINGS: CT HEAD FINDINGS Brain: No evidence of acute infarction, hemorrhage, hydrocephalus, extra-axial collection or mass lesion/mass effect. Mild cerebral atrophy. Vascular: No hyperdense vessel or unexpected calcification. Skull: Calvarium appears intact. Sinuses/Orbits: Mucosal thickening in the paranasal sinuses. No acute air-fluid levels. Mastoid air cells are clear. Other:  None. CT CERVICAL SPINE FINDINGS Alignment: Straightening of usual cervical lordosis without anterior subluxation. This is likely positional but could indicate muscle spasm. Skull base and vertebrae: Skull base appears intact. No vertebral compression deformities. No focal bone lesion or bone destruction. Soft tissues and spinal canal: No prevertebral soft tissue swelling. No abnormal paraspinal soft tissue mass or infiltration. Disc levels: Degenerative changes with narrowed disc spaces and endplate osteophyte formation throughout. Degenerative changes in the facet joints. Uncovertebral spurring causes bone encroachment upon bilateral neural foramina. Upper chest: Lung apices are clear. Other: None. IMPRESSION: 1. No acute intracranial abnormalities. 2. Nonspecific straightening of usual cervical lordosis. No acute displaced fractures identified. Degenerative changes. Electronically Signed   By: Lucienne Capers M.D.   On: 02/24/2021 03:42   DG Chest Port 1 View  Result Date: 02/24/2021 CLINICAL DATA:  Fall EXAM: PORTABLE CHEST 1 VIEW COMPARISON:  06/20/2013 FINDINGS: The heart size and mediastinal contours are within normal limits. Both lungs are clear. The visualized skeletal structures are unremarkable. IMPRESSION: No active disease. Electronically Signed   By: Ulyses Jarred M.D.   On: 02/24/2021 02:57    EKG: Personally reviewed.  Rhythm is atrial fibrillation with heart rate of 129 bpm.  No dynamic ST segment changes appreciated.  Assessment/Plan Principal Problem:   Atrial fibrillation with rapid ventricular response (HCC)  Patient presenting status post fall with questionable loss of consciousness found to be in rapid atrial fibrillation Patient has no known history of atrial fibrillation but does report intermittent palpitations that have been ongoing for weeks to months. Patient has been initiated on a diltiazem infusion at the emergency department staff which seems to be starting to achieve  rate control. Patient can be transition to oral AV nodal blocking agents once rate control is consistently achieved. CHA2DS2-VASc score is 1 (age) -will initiate patient on Eliquis 5 mg p.o. twice daily Obtain magnesium, TSH, troponin, echocardiogram, D-dimer.  Additionally obtaining urinalysis and chest x-ray to identify if there is any underlying infection.   Patient reports moderately heavy alcohol use which may be the culprit Based on ease of rate control will consider cardiology consultation on day shift.  Active Problems: Heavy alcohol use  Patient reports drinking 3-4 beers daily and yesterday evening drinking at least 4 beers and 3 shots of liquor. No history of alcohol withdrawal Will counsel on cessation Will initiate CIWA protocol  Syncope  Patient experienced what seems to be a brief episode of syncope while walking to the bathroom yesterday evening. Gently hydrating patient Obtaining echocardiogram Monitoring patient on telemetry    Hypothyroidism We will resume her regimen of Synthroid once patient is able to confirm his dose Obtain TSH    Code Status:  Full code Family Communication: deferred   Status is: Observation  The patient remains OBS appropriate and will d/c before 2 midnights.  Dispo: The patient is from: Home              Anticipated d/c is to: Home              Patient currently is not medically stable to d/c.   Difficult to place patient No        Vernelle Emerald MD [facility] Pager [phone]  If 7PM-7AM, please contact night-coverage [email or web address] Use universal [place] password for that web site. If you do not have the password, please call the hospital operator.  02/24/2021, 7:17 AM

## 2021-02-24 NOTE — ED Notes (Signed)
Pt stated he had a headache. Pt has PRN Tylenol.

## 2021-02-24 NOTE — ED Notes (Signed)
Pt's wife advised staff that pt removed collar after CT scan performed.

## 2021-02-24 NOTE — ED Provider Notes (Signed)
[facility] EMERGENCY DEPARTMENT Provider Note   CSN: [id number] Arrival date & time: 02/24/21  0145     History Chief Complaint  Patient presents with   Dylan Hale is a 70 y.o. male.  Patient presents to the emergency department for evaluation after a fall.  Patient reports that he slipped while he was in the shower and fell, crashing through the glass door to the shower.  He likely hit his head on a bench.  Family reports that he was briefly knocked out.  He has had some alcohol intake tonight.  Patient with mild headache.  Denies neck and back pain.  No chest pain, heart palpitations, shortness of breath.      Past Medical History:  Diagnosis Date   Anxiety    Chest pain    Depression    Enlarged prostate    Hyperlipidemia    Hypogonadism male    Hypothyroidism    Plantar fibromatosis    right foot    Patient Active Problem List   Diagnosis Date Noted   Atrial fibrillation with rapid ventricular response ([place]) 02/24/2021   Hypothyroidism 02/24/2021   Chest pain 04/27/2013   Hyperlipidemia 04/27/2013    Past Surgical History:  Procedure Laterality Date   MASS EXCISION Right 08/02/2015   Procedure: EXCISION OF RIGHT FOOT MASS;  Surgeon: Wylene Simmer, MD;  Location: [facility];  Service: Orthopedics;  Laterality: Right;       Family History  Problem Relation Age of Onset   Prostate cancer Father    Anxiety disorder Mother    Autoimmune disease Mother    Colon cancer Maternal Grandmother    Emphysema Maternal Grandfather    Lung cancer Paternal Grandfather     Social History   Tobacco Use   Smoking status: Never   Smokeless tobacco: Never  Vaping Use   Vaping Use: Never used  Substance Use Topics   Alcohol use: Yes    Alcohol/week: 2.0 - 4.0 standard drinks    Types: 2 - 4 Standard drinks or equivalent per week    Comment: social   Drug use: No    Home Medications Prior to Admission medications    Medication Sig Start Date End Date Taking? Authorizing Provider  aspirin EC 81 MG tablet Take 81 mg by mouth daily.    [provider]  Cholecalciferol (VITAMIN D-3) 1000 UNITS CAPS Take 2,000 Units by mouth daily.    [provider]  levothyroxine (SYNTHROID, LEVOTHROID) 75 MCG tablet Take 1 tablet by mouth daily. 03/18/13   [provider]  Omega-3 Fatty Acids (FISH OIL) 1000 MG CAPS Take by mouth.    [provider]  PARoxetine (PAXIL) 30 MG tablet Take 1 tablet by mouth daily. 03/18/13   [provider]  Testosterone 30 MG/ACT SOLN APPLY 2 PUMPS TO EACH UNDERARM EVERY MORNING AFTER SHOWER 03/11/17   [provider]    Allergies    Patient has no known allergies.  Review of Systems   Review of Systems  Neurological:  Positive for headaches.  All other systems reviewed and are negative.  Physical Exam Updated Vital Signs BP 111/78   Pulse 80   Temp (!) 96.8 F (36 C) (Temporal)   Resp 15   Ht 5\' 10"  (1.778 m)   Wt 81.6 kg   SpO2 96%   BMI 25.83 kg/m   Physical Exam Vitals and nursing note reviewed.  Constitutional:  General: He is not in acute distress.    Appearance: Normal appearance. He is well-developed.  HENT:     Head: Normocephalic. Contusion present.      Right Ear: Hearing normal.     Left Ear: Hearing normal.     Nose: Nose normal.  Eyes:     Conjunctiva/sclera: Conjunctivae normal.     Pupils: Pupils are equal, round, and reactive to light.  Cardiovascular:     Rate and Rhythm: Tachycardia present. Rhythm irregularly irregular.     Heart sounds: S1 normal and S2 normal. No murmur heard.   No friction rub. No gallop.  Pulmonary:     Effort: Pulmonary effort is normal. No respiratory distress.     Breath sounds: Normal breath sounds.  Chest:     Chest wall: No tenderness.  Abdominal:     General: Bowel sounds are normal.     Palpations: Abdomen is soft.     Tenderness: There is no abdominal  tenderness. There is no guarding or rebound. Negative signs include Murphy's sign and McBurney's sign.     Hernia: No hernia is present.  Musculoskeletal:        General: Normal range of motion.     Cervical back: Normal range of motion and neck supple.  Skin:    General: Skin is warm and dry.     Findings: No rash.  Neurological:     Mental Status: He is alert and oriented to person, place, and time.     GCS: GCS eye subscore is 4. GCS verbal subscore is 5. GCS motor subscore is 6.     Cranial Nerves: No cranial nerve deficit.     Sensory: No sensory deficit.     Coordination: Coordination normal.  Psychiatric:        Speech: Speech normal.        Behavior: Behavior normal.        Thought Content: Thought content normal.    ED Results / Procedures / Treatments   Labs (all labs ordered are listed, but only abnormal results are displayed) Labs Reviewed  BASIC METABOLIC PANEL - Abnormal; Notable for the following components:      Result Value   Glucose, Bld 100 (*)    Calcium 8.7 (*)    All other components within normal limits  CBG MONITORING, ED - Abnormal; Notable for the following components:   Glucose-Capillary 108 (*)    All other components within normal limits  TROPONIN I (HIGH SENSITIVITY) - Abnormal; Notable for the following components:   Troponin I (High Sensitivity) 19 (*)    All other components within normal limits  RESP PANEL BY RT-PCR (FLU A&B, COVID) ARPGX2  CBC WITH DIFFERENTIAL/PLATELET  TSH  MAGNESIUM  D-DIMER, QUANTITATIVE  HIV ANTIBODY (ROUTINE TESTING W REFLEX)  TROPONIN I (HIGH SENSITIVITY)    EKG EKG Interpretation  Date/Time:  Sunday February 24 2021 01:52:19 EDT Ventricular Rate:  129 PR Interval:    QRS Duration: 86 QT Interval:  302 QTC Calculation: 443 R Axis:   -31 Text Interpretation: Atrial fibrillation Left axis deviation Low voltage, precordial leads Confirmed by Orpah Greek [phone]) on 02/24/2021 3:11:55 AM  Radiology CT  HEAD WO CONTRAST  Result Date: 02/24/2021 CLINICAL DATA:  Head and neck trauma. EXAM: CT HEAD WITHOUT CONTRAST CT CERVICAL SPINE WITHOUT CONTRAST TECHNIQUE: Multidetector CT imaging of the head and cervical spine was performed following the standard protocol without intravenous contrast. Multiplanar CT image reconstructions of the cervical spine  were also generated. COMPARISON:  None. FINDINGS: CT HEAD FINDINGS Brain: No evidence of acute infarction, hemorrhage, hydrocephalus, extra-axial collection or mass lesion/mass effect. Mild cerebral atrophy. Vascular: No hyperdense vessel or unexpected calcification. Skull: Calvarium appears intact. Sinuses/Orbits: Mucosal thickening in the paranasal sinuses. No acute air-fluid levels. Mastoid air cells are clear. Other: None. CT CERVICAL SPINE FINDINGS Alignment: Straightening of usual cervical lordosis without anterior subluxation. This is likely positional but could indicate muscle spasm. Skull base and vertebrae: Skull base appears intact. No vertebral compression deformities. No focal bone lesion or bone destruction. Soft tissues and spinal canal: No prevertebral soft tissue swelling. No abnormal paraspinal soft tissue mass or infiltration. Disc levels: Degenerative changes with narrowed disc spaces and endplate osteophyte formation throughout. Degenerative changes in the facet joints. Uncovertebral spurring causes bone encroachment upon bilateral neural foramina. Upper chest: Lung apices are clear. Other: None. IMPRESSION: 1. No acute intracranial abnormalities. 2. Nonspecific straightening of usual cervical lordosis. No acute displaced fractures identified. Degenerative changes. Electronically Signed   By: Lucienne Capers M.D.   On: 02/24/2021 03:42   CT CERVICAL SPINE WO CONTRAST  Result Date: 02/24/2021 CLINICAL DATA:  Head and neck trauma. EXAM: CT HEAD WITHOUT CONTRAST CT CERVICAL SPINE WITHOUT CONTRAST TECHNIQUE: Multidetector CT imaging of the head and  cervical spine was performed following the standard protocol without intravenous contrast. Multiplanar CT image reconstructions of the cervical spine were also generated. COMPARISON:  None. FINDINGS: CT HEAD FINDINGS Brain: No evidence of acute infarction, hemorrhage, hydrocephalus, extra-axial collection or mass lesion/mass effect. Mild cerebral atrophy. Vascular: No hyperdense vessel or unexpected calcification. Skull: Calvarium appears intact. Sinuses/Orbits: Mucosal thickening in the paranasal sinuses. No acute air-fluid levels. Mastoid air cells are clear. Other: None. CT CERVICAL SPINE FINDINGS Alignment: Straightening of usual cervical lordosis without anterior subluxation. This is likely positional but could indicate muscle spasm. Skull base and vertebrae: Skull base appears intact. No vertebral compression deformities. No focal bone lesion or bone destruction. Soft tissues and spinal canal: No prevertebral soft tissue swelling. No abnormal paraspinal soft tissue mass or infiltration. Disc levels: Degenerative changes with narrowed disc spaces and endplate osteophyte formation throughout. Degenerative changes in the facet joints. Uncovertebral spurring causes bone encroachment upon bilateral neural foramina. Upper chest: Lung apices are clear. Other: None. IMPRESSION: 1. No acute intracranial abnormalities. 2. Nonspecific straightening of usual cervical lordosis. No acute displaced fractures identified. Degenerative changes. Electronically Signed   By: Lucienne Capers M.D.   On: 02/24/2021 03:42   DG Chest Port 1 View  Result Date: 02/24/2021 CLINICAL DATA:  Fall EXAM: PORTABLE CHEST 1 VIEW COMPARISON:  06/20/2013 FINDINGS: The heart size and mediastinal contours are within normal limits. Both lungs are clear. The visualized skeletal structures are unremarkable. IMPRESSION: No active disease. Electronically Signed   By: Ulyses Jarred M.D.   On: 02/24/2021 02:57    Procedures Procedures   Medications  Ordered in ED Medications  diltiazem (CARDIZEM) 1 mg/mL load via infusion 10 mg (10 mg Intravenous Bolus from Bag 02/24/21 0358)    And  diltiazem (CARDIZEM) 125 mg in dextrose 5% 125 mL (1 mg/mL) infusion (5 mg/hr Intravenous Rate/Dose Verify 02/24/21 0434)  acetaminophen (TYLENOL) tablet 650 mg (has no administration in time range)  ondansetron (ZOFRAN) injection 4 mg (has no administration in time range)  enoxaparin (LOVENOX) injection 40 mg (has no administration in time range)  sodium chloride 0.9 % bolus 500 mL (0 mLs Intravenous Stopped 02/24/21 0358)  acetaminophen (TYLENOL) tablet 1,000 mg (1,000  mg Oral Given 02/24/21 0359)    ED Course  I have reviewed the triage vital signs and the nursing notes.  Pertinent labs & imaging results that were available during my care of the patient were reviewed by me and considered in my medical decision making (see chart for details).    MDM Rules/Calculators/A&P     CHA2DS2-VASc Score: 1                    Patient presents to the emergency department for evaluation of a fall.  Patient reports that he fell in the shower, broke through the glass door of the shower and hit his head on a bench.  There was brief loss of consciousness.  He does admit to some alcohol intake tonight.  Patient is alert and oriented at time of evaluation.  He does have a large contusion on the posterior aspect of his scalp.  CT head does not show any acute abnormality.  CT cervical spine was negative.  Patient was noted to be in atrial fibrillation at arrival.  He does not have a history of atrial fibrillation.  Initiated on rate control with Cardizem.  He wished to be monitored for a while to see if he would convert back to sinus rhythm.  This has been performed and although his rate is controlled he is still in sinus rhythm, will admit for further management.  Patient did not sense any palpitations, shortness of breath or cardiac irregularity and therefore it is unknown how long he  has been in atrial fibrillation.  CHA2DS2/VAS Stroke Risk Points  Current as of a minute ago     1 >= 2 Points: High Risk  1 - 1.99 Points: Medium Risk  0 Points: Low Risk    No Change      Details    This score determines the patient's risk of having a stroke if the  patient has atrial fibrillation.       Points Metrics  0 Has Congestive Heart Failure:  No    Current as of a minute ago  0 Has Vascular Disease:  No    Current as of a minute ago  0 Has Hypertension:  No    Current as of a minute ago  1 Age:  38    Current as of a minute ago  0 Has Diabetes:  No    Current as of a minute ago  0 Had Stroke:  No  Had TIA:  No  Had Thromboembolism:  No    Current as of a minute ago  0 Male:  No    Current as of a minute ago           Final Clinical Impression(s) / ED Diagnoses Final diagnoses:  Minor head injury, initial encounter  Atrial fibrillation with [facility])    Rx / DC Orders ED Discharge Orders     None        Orpah Greek, MD 02/24/21 [phone]

## 2021-02-24 NOTE — ED Notes (Signed)
Ordered Breakfast 

## 2021-02-24 NOTE — ED Notes (Signed)
Echocardiogram being completed at the bedside.

## 2021-02-24 NOTE — Progress Notes (Signed)
[facility] PROGRESS NOTE    Progress Note  Derril Franek  AST:[id number] DOB: 06/14/1951 DOA: 02/24/2021 PCP: Bayard Beaver., MD     Brief Narrative:   Sami Froh is an 70 y.o. male past medical history of hypothyroidism anxiety comes into the [facility], ED status post fall.  The evening of admission patient relates he went out to a bar to drink, came back home slept of couple of hours woke up to go to the bathroom and on his way lost consciousness.  He relates he immediately regained consciousness, he relates episodes of intermittent palpitation.  In the ED he was found to be in A. fib with RVR started on a Cardizem infusion   Assessment/Plan:   Atrial fibrillation with rapid ventricular response ([place]): Currently on IV diltiazem rate controlled, still in A. fib with RVR. Will have to start him on Eliquis, consult cardiology for possible DC cardioversion. 2D echo results are pending last 2D echo in 2014 showed an EF of 60%.  CHA2DS2-VASc Score = 1  This indicates a 0.6% annual risk of stroke. The patient's score is based upon: CHF History: No HTN History: No Diabetes History: No Stroke History: No Vascular Disease History: No Age Score: 1 Gender Score: 0      Hypothyroidism: Continue Synthroid.   DVT prophylaxis: SCD Family Communication:Wife Status is: Observation  The patient will require care spanning > 2 midnights and should be moved to inpatient because: Hemodynamically unstable  Dispo: The patient is from: Home              Anticipated d/c is to: Home              Patient currently is not medically stable to d/c.   Difficult to place patient No        Code Status:     Code Status Orders  (From admission, onward)           Start     Ordered   02/24/21 0625  Full code  Continuous        02/24/21 0626           Code Status History     This patient has a current code status but no historical code status.         IV  Access:   Peripheral IV   Procedures and diagnostic studies:   CT HEAD WO CONTRAST  Result Date: 02/24/2021 CLINICAL DATA:  Head and neck trauma. EXAM: CT HEAD WITHOUT CONTRAST CT CERVICAL SPINE WITHOUT CONTRAST TECHNIQUE: Multidetector CT imaging of the head and cervical spine was performed following the standard protocol without intravenous contrast. Multiplanar CT image reconstructions of the cervical spine were also generated. COMPARISON:  None. FINDINGS: CT HEAD FINDINGS Brain: No evidence of acute infarction, hemorrhage, hydrocephalus, extra-axial collection or mass lesion/mass effect. Mild cerebral atrophy. Vascular: No hyperdense vessel or unexpected calcification. Skull: Calvarium appears intact. Sinuses/Orbits: Mucosal thickening in the paranasal sinuses. No acute air-fluid levels. Mastoid air cells are clear. Other: None. CT CERVICAL SPINE FINDINGS Alignment: Straightening of usual cervical lordosis without anterior subluxation. This is likely positional but could indicate muscle spasm. Skull base and vertebrae: Skull base appears intact. No vertebral compression deformities. No focal bone lesion or bone destruction. Soft tissues and spinal canal: No prevertebral soft tissue swelling. No abnormal paraspinal soft tissue mass or infiltration. Disc levels: Degenerative changes with narrowed disc spaces and endplate osteophyte formation throughout. Degenerative changes in the facet joints. Uncovertebral spurring causes  bone encroachment upon bilateral neural foramina. Upper chest: Lung apices are clear. Other: None. IMPRESSION: 1. No acute intracranial abnormalities. 2. Nonspecific straightening of usual cervical lordosis. No acute displaced fractures identified. Degenerative changes. Electronically Signed   By: Lucienne Capers M.D.   On: 02/24/2021 03:42   CT CERVICAL SPINE WO CONTRAST  Result Date: 02/24/2021 CLINICAL DATA:  Head and neck trauma. EXAM: CT HEAD WITHOUT CONTRAST CT CERVICAL SPINE  WITHOUT CONTRAST TECHNIQUE: Multidetector CT imaging of the head and cervical spine was performed following the standard protocol without intravenous contrast. Multiplanar CT image reconstructions of the cervical spine were also generated. COMPARISON:  None. FINDINGS: CT HEAD FINDINGS Brain: No evidence of acute infarction, hemorrhage, hydrocephalus, extra-axial collection or mass lesion/mass effect. Mild cerebral atrophy. Vascular: No hyperdense vessel or unexpected calcification. Skull: Calvarium appears intact. Sinuses/Orbits: Mucosal thickening in the paranasal sinuses. No acute air-fluid levels. Mastoid air cells are clear. Other: None. CT CERVICAL SPINE FINDINGS Alignment: Straightening of usual cervical lordosis without anterior subluxation. This is likely positional but could indicate muscle spasm. Skull base and vertebrae: Skull base appears intact. No vertebral compression deformities. No focal bone lesion or bone destruction. Soft tissues and spinal canal: No prevertebral soft tissue swelling. No abnormal paraspinal soft tissue mass or infiltration. Disc levels: Degenerative changes with narrowed disc spaces and endplate osteophyte formation throughout. Degenerative changes in the facet joints. Uncovertebral spurring causes bone encroachment upon bilateral neural foramina. Upper chest: Lung apices are clear. Other: None. IMPRESSION: 1. No acute intracranial abnormalities. 2. Nonspecific straightening of usual cervical lordosis. No acute displaced fractures identified. Degenerative changes. Electronically Signed   By: Lucienne Capers M.D.   On: 02/24/2021 03:42   DG Chest Port 1 View  Result Date: 02/24/2021 CLINICAL DATA:  Fall EXAM: PORTABLE CHEST 1 VIEW COMPARISON:  06/20/2013 FINDINGS: The heart size and mediastinal contours are within normal limits. Both lungs are clear. The visualized skeletal structures are unremarkable. IMPRESSION: No active disease. Electronically Signed   By: Ulyses Jarred  M.D.   On: 02/24/2021 02:57     Medical Consultants:   None.   Subjective:    [name] relates he feels great not hungry has not eaten in over 12 hours.  Objective:    Vitals:   02/24/21 0615 02/24/21 0630 02/24/21 0645 02/24/21 0730  BP: 111/87 111/78 (!) 114/92 116/73  Pulse: (!) 27 80 (!) 58 79  Resp: (!) 7 15 (!) 24 16  Temp:      TempSrc:      SpO2: 99% 96% 98% 95%  Weight:      Height:       SpO2: 95 %   Intake/Output Summary (Last 24 hours) at 02/24/2021 0828 Last data filed at 02/24/2021 0701 Gross per 24 hour  Intake 500 ml  Output 1400 ml  Net -900 ml   Filed Weights   02/24/21 0148  Weight: 81.6 kg    Exam: General exam: In no acute distress. Respiratory system: Good air movement and clear to auscultation. Cardiovascular system: S1 & S2 heard, RRR. No JVD. Gastrointestinal system: Abdomen is nondistended, soft and nontender.  Extremities: No pedal edema. Skin: No rashes, lesions or ulcers Psychiatry: Judgement and insight appear normal. Mood & affect appropriate.    Data Reviewed:    Labs: Basic Metabolic Panel: Recent Labs  Lab 02/24/21 0220 02/24/21 0639  NA 142  --   K 3.9  --   CL 110  --   CO2 23  --  GLUCOSE 100*  --   BUN 18  --   CREATININE 1.16  --   CALCIUM 8.7*  --   MG  --  2.0   GFR Estimated Creatinine Clearance: 62.1 mL/min (by C-G formula based on SCr of 1.16 mg/dL). Liver Function Tests: No results for input(s): AST, ALT, ALKPHOS, BILITOT, PROT, ALBUMIN in the last 168 hours. No results for input(s): LIPASE, AMYLASE in the last 168 hours. No results for input(s): AMMONIA in the last 168 hours. Coagulation profile No results for input(s): INR, PROTIME in the last 168 hours. COVID-19 Labs  Recent Labs    02/24/21 0639  DDIMER 0.29    Lab Results  Component Value Date   SARSCOV2NAA NEGATIVE 02/24/2021    CBC: Recent Labs  Lab 02/24/21 0220  WBC 6.1  NEUTROABS 2.9  HGB 16.0  HCT 47.1  MCV  95.7  PLT 207   Cardiac Enzymes: No results for input(s): CKTOTAL, CKMB, CKMBINDEX, TROPONINI in the last 168 hours. BNP (last 3 results) No results for input(s): PROBNP in the last 8760 hours. CBG: Recent Labs  Lab 02/24/21 0147  GLUCAP 108*   D-Dimer: Recent Labs    02/24/21 0639  DDIMER 0.29   Hgb A1c: No results for input(s): HGBA1C in the last 72 hours. Lipid Profile: No results for input(s): CHOL, HDL, LDLCALC, TRIG, CHOLHDL, LDLDIRECT in the last 72 hours. Thyroid function studies: No results for input(s): TSH, T4TOTAL, T3FREE, THYROIDAB in the last 72 hours.  Invalid input(s): FREET3 Anemia work up: No results for input(s): VITAMINB12, FOLATE, FERRITIN, TIBC, IRON, RETICCTPCT in the last 72 hours. Sepsis Labs: Recent Labs  Lab 02/24/21 0220  WBC 6.1   Microbiology Recent Results (from the past 240 hour(s))  Resp Panel by RT-PCR (Flu A&B, Covid) Nasopharyngeal Swab     Status: None   Collection Time: 02/24/21  5:29 AM   Specimen: Nasopharyngeal Swab; Nasopharyngeal(NP) swabs in vial transport medium  Result Value Ref Range Status   SARS Coronavirus 2 by RT PCR NEGATIVE NEGATIVE Final    Comment: (NOTE) SARS-CoV-2 target nucleic acids are NOT DETECTED.  The SARS-CoV-2 RNA is generally detectable in upper respiratory specimens during the acute phase of infection. The lowest concentration of SARS-CoV-2 viral copies this assay can detect is 138 copies/mL. A negative result does not preclude SARS-Cov-2 infection and should not be used as the sole basis for treatment or other patient management decisions. A negative result may occur with  improper specimen collection/handling, submission of specimen other than nasopharyngeal swab, presence of viral mutation(s) within the areas targeted by this assay, and inadequate number of viral copies(<138 copies/mL). A negative result must be combined with clinical observations, patient history, and  epidemiological information. The expected result is Negative.  Fact Sheet for Patients:  EntrepreneurPulse.com.au  Fact Sheet for Healthcare Providers:  IncredibleEmployment.be  This test is no t yet approved or cleared by the [place] FDA and  has been authorized for detection and/or diagnosis of SARS-CoV-2 by FDA under an Emergency Use Authorization (EUA). This EUA will remain  in effect (meaning this test can be used) for the duration of the COVID-19 declaration under Section 564(b)(1) of the Act, 21 U.S.C.section 360bbb-3(b)(1), unless the authorization is terminated  or revoked sooner.       Influenza A by PCR NEGATIVE NEGATIVE Final   Influenza B by PCR NEGATIVE NEGATIVE Final    Comment: (NOTE) The Xpert Xpress SARS-CoV-2/FLU/RSV plus assay is intended as an aid in the diagnosis  of influenza from Nasopharyngeal swab specimens and should not be used as a sole basis for treatment. Nasal washings and aspirates are unacceptable for Xpert Xpress SARS-CoV-2/FLU/RSV testing.  Fact Sheet for Patients: EntrepreneurPulse.com.au  Fact Sheet for Healthcare Providers: IncredibleEmployment.be  This test is not yet approved or cleared by the [place] FDA and has been authorized for detection and/or diagnosis of SARS-CoV-2 by FDA under an Emergency Use Authorization (EUA). This EUA will remain in effect (meaning this test can be used) for the duration of the COVID-19 declaration under Section 564(b)(1) of the Act, 21 U.S.C. section 360bbb-3(b)(1), unless the authorization is terminated or revoked.  Performed at [facility], [address] [place], [place] [address]      Medications:    clonazePAM  0.25 mg Oral BID   enoxaparin (LOVENOX) injection  40 mg Subcutaneous Daily   folic acid  1 mg Oral Daily   levothyroxine  75 mcg Oral Q0600   multivitamin with minerals  1 tablet Oral Daily    PARoxetine  30 mg Oral Daily   tamsulosin  0.4 mg Oral Daily   thiamine  100 mg Oral Daily   Or   thiamine  100 mg Intravenous Daily   Continuous Infusions:  diltiazem (CARDIZEM) infusion 7.5 mg/hr (02/24/21 0653)      LOS: 0 days   Charlynne Cousins  [facility]  02/24/2021, 8:28 AM

## 2021-02-24 NOTE — ED Notes (Signed)
Pt states he is a little anxious, but "not too bad" -- denies any chest pain. He is concerned about heart rate and rhythm. is in a room without a TV.

## 2021-02-24 NOTE — ED Triage Notes (Addendum)
Pt was in the shower about one hour PTA when family heard glass break.  Pt had fallen in the shower and hit back of head on bench.  Brief LOC per family.  Hx of ETOH use per family but unable to confirm tonight.  No blood thinners.  Pt endorses 3 beers and 2 shots when asked at time of triage

## 2021-02-24 NOTE — Progress Notes (Signed)
  Echocardiogram 2D Echocardiogram has been performed.  Dylan Hale 02/24/2021, 5:34 PM

## 2021-02-24 NOTE — Progress Notes (Signed)
New pt admission from ED. Pt brought to the floor in stable condition. Vitals taken. Initial Assessment done. All immediate pertinent needs to patient addressed. Patient Guide given to patient. Important safety instructions relating to hospitalization reviewed with patient. Patient verbalized understanding. Pt wife is present at bed side. Pt's CIWA is 3. Will continue to monitor pt.   Palma Holter, RN

## 2021-02-25 DIAGNOSIS — I4819 Other persistent atrial fibrillation: Secondary | ICD-10-CM | POA: Diagnosis not present

## 2021-02-25 DIAGNOSIS — I4891 Unspecified atrial fibrillation: Secondary | ICD-10-CM | POA: Diagnosis not present

## 2021-02-25 DIAGNOSIS — F1010 Alcohol abuse, uncomplicated: Secondary | ICD-10-CM | POA: Diagnosis not present

## 2021-02-25 DIAGNOSIS — E039 Hypothyroidism, unspecified: Secondary | ICD-10-CM | POA: Diagnosis not present

## 2021-02-25 DIAGNOSIS — R55 Syncope and collapse: Secondary | ICD-10-CM | POA: Diagnosis not present

## 2021-02-25 DIAGNOSIS — N179 Acute kidney failure, unspecified: Secondary | ICD-10-CM

## 2021-02-25 LAB — CBC WITH DIFFERENTIAL/PLATELET
Abs Immature Granulocytes: 0.04 10*3/uL (ref 0.00–0.07)
Basophils Absolute: 0.1 10*3/uL (ref 0.0–0.1)
Basophils Relative: 1 %
Eosinophils Absolute: 0.3 10*3/uL (ref 0.0–0.5)
Eosinophils Relative: 4 %
HCT: 45.2 % (ref 39.0–52.0)
Hemoglobin: 16 g/dL (ref 13.0–17.0)
Immature Granulocytes: 1 %
Lymphocytes Relative: 24 %
Lymphs Abs: 1.9 10*3/uL (ref 0.7–4.0)
MCH: 32.8 pg (ref 26.0–34.0)
MCHC: 35.4 g/dL (ref 30.0–36.0)
MCV: 92.6 fL (ref 80.0–100.0)
Monocytes Absolute: 0.9 10*3/uL (ref 0.1–1.0)
Monocytes Relative: 11 %
Neutro Abs: 5 10*3/uL (ref 1.7–7.7)
Neutrophils Relative %: 59 %
Platelets: 225 10*3/uL (ref 150–400)
RBC: 4.88 MIL/uL (ref 4.22–5.81)
RDW: 12.1 % (ref 11.5–15.5)
WBC: 8.2 10*3/uL (ref 4.0–10.5)
nRBC: 0 % (ref 0.0–0.2)

## 2021-02-25 LAB — COMPREHENSIVE METABOLIC PANEL
ALT: 22 U/L (ref 0–44)
AST: 16 U/L (ref 15–41)
Albumin: 3.4 g/dL — ABNORMAL LOW (ref 3.5–5.0)
Alkaline Phosphatase: 66 U/L (ref 38–126)
Anion gap: 5 (ref 5–15)
BUN: 25 mg/dL — ABNORMAL HIGH (ref 8–23)
CO2: 26 mmol/L (ref 22–32)
Calcium: 9 mg/dL (ref 8.9–10.3)
Chloride: 107 mmol/L (ref 98–111)
Creatinine, Ser: 1.55 mg/dL — ABNORMAL HIGH (ref 0.61–1.24)
GFR, Estimated: 48 mL/min — ABNORMAL LOW (ref >60)
Glucose, Bld: 106 mg/dL — ABNORMAL HIGH (ref 70–99)
Potassium: 3.7 mmol/L (ref 3.5–5.1)
Sodium: 138 mmol/L (ref 135–145)
Total Bilirubin: 0.7 mg/dL (ref 0.3–1.2)
Total Protein: 5.8 g/dL — ABNORMAL LOW (ref 6.5–8.1)

## 2021-02-25 LAB — MAGNESIUM: Magnesium: 2 mg/dL (ref 1.7–2.4)

## 2021-02-25 MED ORDER — SODIUM CHLORIDE 0.9 % IV BOLUS: 1000.0000 mL | Freq: Once | INTRAVENOUS | Status: DC

## 2021-02-25 MED ORDER — METOPROLOL TARTRATE 25 MG PO TABS: 12.5000 mg | ORAL_TABLET | Freq: Two times a day (BID) | ORAL | 1 refills | Status: DC

## 2021-02-25 MED ORDER — APIXABAN 5 MG PO TABS
5.0000 mg | ORAL_TABLET | Freq: Two times a day (BID) | ORAL | 0 refills | Status: DC
Start: 2021-02-25 — End: 2021-03-20

## 2021-02-25 MED ORDER — SODIUM CHLORIDE 0.9 % IV SOLN: INTRAVENOUS | Status: DC

## 2021-02-25 MED ORDER — SODIUM CHLORIDE 0.9 % IV SOLN: INTRAVENOUS | Status: AC

## 2021-02-25 MED ORDER — METOPROLOL TARTRATE 25 MG PO TABS: 12.5000 mg | ORAL_TABLET | Freq: Every day | ORAL | 1 refills | Status: DC

## 2021-02-25 NOTE — TOC Transition Note (Signed)
Transition of Care [facility]) - CM/SW Discharge Note   Patient Details  Name: Crayton Savarese MRN: [id number] Date of Birth: 1950/10/12  [facility]) CM/SW Contact:  Bartholomew Crews, RN Phone Number: [phone] 02/25/2021, 1:22 PM   Clinical Narrative:     Spoke with patient and wife at the bedside. Provide 30 day Eliquis and $10 copay cards. Patient expressed appreciation.   Discussed avoiding alcohol while on new medication. Patient verbalized understanding stating that overindulgence is a rare occurrence and he understands to be additionally cautious with new medications.   Provided follow up information for cardiology on AVS.   No further TOC needs identified.   Final next level of care: Home/Self Care Barriers to Discharge: No Barriers Identified   Patient Goals and CMS Choice Patient states their goals for this hospitalization and ongoing recovery are:: retun home with wife CMS Medicare.gov Compare Post Acute Care list provided to:: Patient Choice offered to / list presented to : NA  Discharge Placement                       Discharge Plan and Services                DME Arranged: N/A DME Agency: NA       HH Arranged: NA HH Agency: NA        Social Determinants of Health (SDOH) Interventions     Readmission Risk Interventions No flowsheet data found.

## 2021-02-25 NOTE — Discharge Summary (Addendum)
Physician Discharge Summary  Dylan Hale TIR:[id number] DOB: Dec 23, 1950 DOA: 02/24/2021  PCP: Bayard Beaver., MD  Admit date: 02/24/2021 Discharge date: 02/25/2021  Admitted From: home Disposition:  Home  Recommendations for Outpatient Follow-up:  Follow up with PCP in 1-2 weeks Please obtain BMP/CBC in one week   Home Health:No Equipment/Devices:none  Discharge Condition:Stable CODE STATUS:Full Diet recommendation: Heart Healthy   Brief/Interim Summary:  70 y.o. male past medical history of hypothyroidism anxiety comes into the [facility], ED status post fall.  The evening of admission patient relates he went out to a bar to drink, came back home slept of couple of hours woke up to go to the bathroom and on his way lost consciousness.  He relates he immediately regained consciousness, he relates episodes of intermittent palpitation.  In the ED he was found to be in A. fib with RVR started on a Cardizem infusion  Discharge Diagnoses:  Principal Problem:   Atrial fibrillation with rapid ventricular response (HCC) Active Problems:   Hypothyroidism   Syncope  A. fib with RVR: Likely due to alcohol use he was started on IV diltiazem and Eliquis. Cardiology was consulted, he converted to sinus rhythm. They recommended to continue metoprolol low-dose and Eliquis and follow-up with them as an outpatient. 's 2D echo showed preserved EF his chest Vascor is 1.  Acute kidney injury: Likely prerenal azotemia he was given liter bolus normal saline recheck basic metabolic panel as an outpatient.  Hypothyroidism: Excellent continue Synthroid.  Discharge Instructions  Discharge Instructions     Diet - low sodium heart healthy   Complete by: As directed    Increase activity slowly   Complete by: As directed       Allergies as of 02/25/2021       Reactions   Sudafed [pseudoephedrine] Other (See Comments)   jitters        Medication List     TAKE these medications     apixaban 5 MG Tabs tablet Commonly known as: ELIQUIS Take 1 tablet (5 mg total) by mouth 2 (two) times daily.   aspirin EC 81 MG tablet Take 81 mg by mouth daily.   clonazePAM 0.5 MG tablet Commonly known as: KLONOPIN Take 0.25 mg by mouth 2 (two) times daily.   Fish Oil 1000 MG Caps Take by mouth.   levothyroxine 75 MCG tablet Commonly known as: SYNTHROID Take 75 mcg by mouth daily.   metoprolol tartrate 25 MG tablet Commonly known as: LOPRESSOR Take 0.5 tablets (12.5 mg total) by mouth daily.   PARoxetine 30 MG tablet Commonly known as: PAXIL Take 30 mg by mouth daily.   tamsulosin 0.4 MG Caps capsule Commonly known as: FLOMAX Take 0.4 mg by mouth daily.   Testosterone 20.25 MG/ACT (1.62%) Gel Apply 2 Pump topically every morning. To each underarm after shower   Vitamin D-3 25 MCG (1000 UT) Caps Take 2,000 Units by mouth daily.        Allergies  Allergen Reactions   Sudafed [Pseudoephedrine] Other (See Comments)    jitters    Consultations: Cardiology   Procedures/Studies: CT HEAD WO CONTRAST  Result Date: 02/24/2021 CLINICAL DATA:  Head and neck trauma. EXAM: CT HEAD WITHOUT CONTRAST CT CERVICAL SPINE WITHOUT CONTRAST TECHNIQUE: Multidetector CT imaging of the head and cervical spine was performed following the standard protocol without intravenous contrast. Multiplanar CT image reconstructions of the cervical spine were also generated. COMPARISON:  None. FINDINGS: CT HEAD FINDINGS Brain: No evidence of acute infarction,  hemorrhage, hydrocephalus, extra-axial collection or mass lesion/mass effect. Mild cerebral atrophy. Vascular: No hyperdense vessel or unexpected calcification. Skull: Calvarium appears intact. Sinuses/Orbits: Mucosal thickening in the paranasal sinuses. No acute air-fluid levels. Mastoid air cells are clear. Other: None. CT CERVICAL SPINE FINDINGS Alignment: Straightening of usual cervical lordosis without anterior subluxation. This is  likely positional but could indicate muscle spasm. Skull base and vertebrae: Skull base appears intact. No vertebral compression deformities. No focal bone lesion or bone destruction. Soft tissues and spinal canal: No prevertebral soft tissue swelling. No abnormal paraspinal soft tissue mass or infiltration. Disc levels: Degenerative changes with narrowed disc spaces and endplate osteophyte formation throughout. Degenerative changes in the facet joints. Uncovertebral spurring causes bone encroachment upon bilateral neural foramina. Upper chest: Lung apices are clear. Other: None. IMPRESSION: 1. No acute intracranial abnormalities. 2. Nonspecific straightening of usual cervical lordosis. No acute displaced fractures identified. Degenerative changes. Electronically Signed   By: Lucienne Capers M.D.   On: 02/24/2021 03:42   CT CERVICAL SPINE WO CONTRAST  Result Date: 02/24/2021 CLINICAL DATA:  Head and neck trauma. EXAM: CT HEAD WITHOUT CONTRAST CT CERVICAL SPINE WITHOUT CONTRAST TECHNIQUE: Multidetector CT imaging of the head and cervical spine was performed following the standard protocol without intravenous contrast. Multiplanar CT image reconstructions of the cervical spine were also generated. COMPARISON:  None. FINDINGS: CT HEAD FINDINGS Brain: No evidence of acute infarction, hemorrhage, hydrocephalus, extra-axial collection or mass lesion/mass effect. Mild cerebral atrophy. Vascular: No hyperdense vessel or unexpected calcification. Skull: Calvarium appears intact. Sinuses/Orbits: Mucosal thickening in the paranasal sinuses. No acute air-fluid levels. Mastoid air cells are clear. Other: None. CT CERVICAL SPINE FINDINGS Alignment: Straightening of usual cervical lordosis without anterior subluxation. This is likely positional but could indicate muscle spasm. Skull base and vertebrae: Skull base appears intact. No vertebral compression deformities. No focal bone lesion or bone destruction. Soft tissues and  spinal canal: No prevertebral soft tissue swelling. No abnormal paraspinal soft tissue mass or infiltration. Disc levels: Degenerative changes with narrowed disc spaces and endplate osteophyte formation throughout. Degenerative changes in the facet joints. Uncovertebral spurring causes bone encroachment upon bilateral neural foramina. Upper chest: Lung apices are clear. Other: None. IMPRESSION: 1. No acute intracranial abnormalities. 2. Nonspecific straightening of usual cervical lordosis. No acute displaced fractures identified. Degenerative changes. Electronically Signed   By: Lucienne Capers M.D.   On: 02/24/2021 03:42   DG Chest Port 1 View  Result Date: 02/24/2021 CLINICAL DATA:  Fall EXAM: PORTABLE CHEST 1 VIEW COMPARISON:  06/20/2013 FINDINGS: The heart size and mediastinal contours are within normal limits. Both lungs are clear. The visualized skeletal structures are unremarkable. IMPRESSION: No active disease. Electronically Signed   By: Ulyses Jarred M.D.   On: 02/24/2021 02:57   ECHOCARDIOGRAM COMPLETE  Result Date: 02/24/2021    ECHOCARDIOGRAM REPORT   Patient Name:   Dylan Hale Date of Exam: 02/24/2021 Medical Rec #:  [id number]        Height:       70.0 in Accession #:    [id number]       Weight:       180.0 lb Date of Birth:  01/31/1951        BSA:          1.996 m Patient Age:    67 years         BP:           128/76 mmHg Patient Gender: M  HR:           54 bpm. Exam Location:  Inpatient Procedure: 2D Echo Indications:    atrial fibrillation  History:        Patient has prior history of Echocardiogram examinations, most                 recent 05/02/2013. Signs/Symptoms:Syncope; Risk                 Factors:Dyslipidemia.  Sonographer:    Johny Chess Referring Phys: [id number] Adrian  1. Left ventricular ejection fraction, by estimation, is 65 to 70%. The left ventricle has normal function. The left ventricle has no regional wall motion abnormalities.  There is moderate left ventricular hypertrophy. Left ventricular diastolic parameters are consistent with Grade I diastolic dysfunction (impaired relaxation).  2. Right ventricular systolic function is normal. The right ventricular size is normal. Tricuspid regurgitation signal is inadequate for assessing PA pressure.  3. The mitral valve is abnormal. trivial to mild mitral valve regurgitation.  4. The aortic valve is tricuspid. Aortic valve regurgitation is mild. Mild aortic valve sclerosis is present, with no evidence of aortic valve stenosis.  5. The inferior vena cava is normal in size with greater than 50% respiratory variability, suggesting right atrial pressure of 3 mmHg. FINDINGS  Left Ventricle: Left ventricular ejection fraction, by estimation, is 65 to 70%. The left ventricle has normal function. The left ventricle has no regional wall motion abnormalities. The left ventricular internal cavity size was normal in size. There is  moderate left ventricular hypertrophy. Left ventricular diastolic parameters are consistent with Grade I diastolic dysfunction (impaired relaxation). Indeterminate filling pressures. Right Ventricle: The right ventricular size is normal. No increase in right ventricular wall thickness. Right ventricular systolic function is normal. Tricuspid regurgitation signal is inadequate for assessing PA pressure. Left Atrium: Left atrial size was normal in size. Right Atrium: Right atrial size was normal in size. Pericardium: There is no evidence of pericardial effusion. Mitral Valve: The mitral valve is abnormal. There is mild thickening of the mitral valve leaflet(s). Trivial to mild mitral valve regurgitation. Tricuspid Valve: The tricuspid valve is grossly normal. Tricuspid valve regurgitation is not demonstrated. Aortic Valve: The aortic valve is tricuspid. Aortic valve regurgitation is mild. Aortic regurgitation PHT measures 713 msec. Mild aortic valve sclerosis is present, with no  evidence of aortic valve stenosis. Pulmonic Valve: The pulmonic valve was normal in structure. Pulmonic valve regurgitation is not visualized. Aorta: The aortic root and ascending aorta are structurally normal, with no evidence of dilitation. Venous: The inferior vena cava is normal in size with greater than 50% respiratory variability, suggesting right atrial pressure of 3 mmHg. IAS/Shunts: No atrial level shunt detected by color flow Doppler.  LEFT VENTRICLE PLAX 2D LVOT diam:     2.10 cm  Diastology LV SV:         74       LV e' medial:    10.00 cm/s LV SV Index:   37       LV E/e' medial:  7.1 LVOT Area:     3.46 cm LV e' lateral:   16.40 cm/s                         LV E/e' lateral: 4.3  RIGHT VENTRICLE             IVC RV S prime:     17.20 cm/s  IVC  diam: 1.80 cm TAPSE (M-mode): 2.3 cm LEFT ATRIUM             Index       RIGHT ATRIUM           Index LA Vol (A2C):   60.3 ml 30.21 ml/m RA Area:     17.40 cm LA Vol (A4C):   55.8 ml 27.96 ml/m RA Volume:   45.30 ml  22.70 ml/m LA Biplane Vol: 61.9 ml 31.01 ml/m  AORTIC VALVE LVOT Vmax:   101.00 cm/s LVOT Vmean:  62.100 cm/s LVOT VTI:    0.214 m AI PHT:      713 msec  AORTA Ao Root diam: 3.00 cm Ao Asc diam:  3.30 cm MITRAL VALVE MV Area (PHT): 3.89 cm    SHUNTS MV Decel Time: 195 msec    Systemic VTI:  0.21 m MV E velocity: 71.10 cm/s  Systemic Diam: 2.10 cm MV A velocity: 39.00 cm/s MV E/A ratio:  1.82 Lyman Bishop MD Electronically signed by Lyman Bishop MD Signature Date/Time: 02/24/2021/6:01:39 PM    Final    US THYROID  Result Date: 02/18/2021 CLINICAL DATA:  Multinodular goiter EXAM: THYROID ULTRASOUND TECHNIQUE: Ultrasound examination of the thyroid gland and adjacent soft tissues was performed. COMPARISON:  01/14/2018 01/09/2017 FINDINGS: Parenchymal Echotexture: Mildly heterogeneous Isthmus: 0.3 cm Right lobe: 5.0 x 2.1 x 2.3 cm Left lobe: 4.3 x 1.2 x 1.3 cm _________________________________________________________ Estimated total number of  nodules >/= 1 cm: 1 Number of spongiform nodules >/=  2 cm not described below (TR1): 0 Number of mixed cystic and solid nodules >/= 1.5 cm not described below (TR2): 0 _________________________________________________________ Nodule # 1: Prior biopsy: No Location: Right; superior Maximum size: 2.7 cm; Other 2 dimensions: 1.5 x 1.8 cm, previously, 3.1 x 1.6 x 2.0 cm on 01/09/2017. Composition: solid/almost completely solid (2) Echogenicity: isoechoic (1) Shape: not taller-than-wide (0) Margins: smooth (0) Echogenic foci: punctate echogenic foci (3) ACR TI-RADS total points: 6. ACR TI-RADS risk category:  TR4 (4-6 points). Significant change in size (>/= 20% in two dimensions and minimal increase of 2 mm): No Change in features: No Change in ACR TI-RADS risk category: No ACR TI-RADS recommendations: **Given size (>/= 1.5 cm) and appearance, fine needle aspiration of this moderately suspicious nodule should be considered based on TI-RADS criteria. _________________________________________________________ Subcentimeter nodule in the inferior left thyroid lobe does not meet criteria for FNA or surveillance. IMPRESSION: Nodule 1 (TI-RADS 4) located in the superior right thyroid lobe meets criteria for FNA. It is not significantly changed in size since 01/09/2017. The above is in keeping with the ACR TI-RADS recommendations - J Am Coll Radiol 2017;14:587-595. Electronically Signed   By: Miachel Roux M.D.   On: 02/18/2021 16:12   (Echo, Carotid, EGD, Colonoscopy, ERCP)    Subjective: No complaints  Discharge Exam: Vitals:   02/24/21 2345 02/25/21 0431  BP: (!) 98/59 102/70  Pulse: (!) 52 80  Resp: 14 13  Temp: 98.3 F (36.8 C) 98.6 F (37 C)  SpO2: 99% 100%   Vitals:   02/24/21 1816 02/24/21 1935 02/24/21 2345 02/25/21 0431  BP: (!) 110/97 108/71 (!) 98/59 102/70  Pulse: 69 (!) 51 (!) 52 80  Resp: 14 18 14 13   Temp: 98.3 F (36.8 C) 98.2 F (36.8 C) 98.3 F (36.8 C) 98.6 F (37 C)  TempSrc: Oral  Oral Oral Oral  SpO2: 94% 95% 99% 100%  Weight: 82.5 kg     Height: 5\' 10"  (  1.778 m)       General: Pt is alert, awake, not in acute distress Cardiovascular: RRR, S1/S2 +, no rubs, no gallops Respiratory: CTA bilaterally, no wheezing, no rhonchi Abdominal: Soft, NT, ND, bowel sounds + Extremities: no edema, no cyanosis    The results of significant diagnostics from this hospitalization (including imaging, microbiology, ancillary and laboratory) are listed below for reference.     Microbiology: Recent Results (from the past 240 hour(s))  Resp Panel by RT-PCR (Flu A&B, Covid) Nasopharyngeal Swab     Status: None   Collection Time: 02/24/21  5:29 AM   Specimen: Nasopharyngeal Swab; Nasopharyngeal(NP) swabs in vial transport medium  Result Value Ref Range Status   SARS Coronavirus 2 by RT PCR NEGATIVE NEGATIVE Final    Comment: (NOTE) SARS-CoV-2 target nucleic acids are NOT DETECTED.  The SARS-CoV-2 RNA is generally detectable in upper respiratory specimens during the acute phase of infection. The lowest concentration of SARS-CoV-2 viral copies this assay can detect is 138 copies/mL. A negative result does not preclude SARS-Cov-2 infection and should not be used as the sole basis for treatment or other patient management decisions. A negative result may occur with  improper specimen collection/handling, submission of specimen other than nasopharyngeal swab, presence of viral mutation(s) within the areas targeted by this assay, and inadequate number of viral copies(<138 copies/mL). A negative result must be combined with clinical observations, patient history, and epidemiological information. The expected result is Negative.  Fact Sheet for Patients:  EntrepreneurPulse.com.au  Fact Sheet for Healthcare Providers:  IncredibleEmployment.be  This test is no t yet approved or cleared by the [place] FDA and  has been authorized for  detection and/or diagnosis of SARS-CoV-2 by FDA under an Emergency Use Authorization (EUA). This EUA will remain  in effect (meaning this test can be used) for the duration of the COVID-19 declaration under Section 564(b)(1) of the Act, 21 U.S.C.section 360bbb-3(b)(1), unless the authorization is terminated  or revoked sooner.       Influenza A by PCR NEGATIVE NEGATIVE Final   Influenza B by PCR NEGATIVE NEGATIVE Final    Comment: (NOTE) The Xpert Xpress SARS-CoV-2/FLU/RSV plus assay is intended as an aid in the diagnosis of influenza from Nasopharyngeal swab specimens and should not be used as a sole basis for treatment. Nasal washings and aspirates are unacceptable for Xpert Xpress SARS-CoV-2/FLU/RSV testing.  Fact Sheet for Patients: EntrepreneurPulse.com.au  Fact Sheet for Healthcare Providers: IncredibleEmployment.be  This test is not yet approved or cleared by the [place] FDA and has been authorized for detection and/or diagnosis of SARS-CoV-2 by FDA under an Emergency Use Authorization (EUA). This EUA will remain in effect (meaning this test can be used) for the duration of the COVID-19 declaration under Section 564(b)(1) of the Act, 21 U.S.C. section 360bbb-3(b)(1), unless the authorization is terminated or revoked.  Performed at [facility], [place] [address] [place], [place] [address]   MRSA Next Gen by PCR, Nasal     Status: None   Collection Time: 02/24/21  6:15 PM   Specimen: Nasal Mucosa; Nasal Swab  Result Value Ref Range Status   MRSA by PCR Next Gen NOT DETECTED NOT DETECTED Final    Comment: (NOTE) The GeneXpert MRSA Assay (FDA approved for NASAL specimens only), is one component of a comprehensive MRSA colonization surveillance program. It is not intended to diagnose MRSA infection nor to guide or monitor treatment for MRSA infections. Test performance is not FDA approved in patients  less than 72  years old. Performed at [facility], [place] [address], [place], [place] [address]      Labs: BNP (last 3 results) No results for input(s): BNP in the last 8760 hours. Basic Metabolic Panel: Recent Labs  Lab 02/24/21 0220 02/24/21 0639 02/25/21 0009  NA 142  --  138  K 3.9  --  3.7  CL 110  --  107  CO2 23  --  26  GLUCOSE 100*  --  106*  BUN 18  --  25*  CREATININE 1.16  --  1.55*  CALCIUM 8.7*  --  9.0  MG  --  2.0 2.0   Liver Function Tests: Recent Labs  Lab 02/25/21 0009  AST 16  ALT 22  ALKPHOS 66  BILITOT 0.7  PROT 5.8*  ALBUMIN 3.4*   No results for input(s): LIPASE, AMYLASE in the last 168 hours. No results for input(s): AMMONIA in the last 168 hours. CBC: Recent Labs  Lab 02/24/21 0220 02/25/21 0009  WBC 6.1 8.2  NEUTROABS 2.9 5.0  HGB 16.0 16.0  HCT 47.1 45.2  MCV 95.7 92.6  PLT 207 225   Cardiac Enzymes: No results for input(s): CKTOTAL, CKMB, CKMBINDEX, TROPONINI in the last 168 hours. BNP: Invalid input(s): POCBNP CBG: Recent Labs  Lab 02/24/21 0147  GLUCAP 108*   D-Dimer Recent Labs    02/24/21 0639  DDIMER 0.29   Hgb A1c No results for input(s): HGBA1C in the last 72 hours. Lipid Profile No results for input(s): CHOL, HDL, LDLCALC, TRIG, CHOLHDL, LDLDIRECT in the last 72 hours. Thyroid function studies Recent Labs    02/24/21 1834  TSH 2.381   Anemia work up No results for input(s): VITAMINB12, FOLATE, FERRITIN, TIBC, IRON, RETICCTPCT in the last 72 hours. Urinalysis    Component Value Date/Time   COLORURINE STRAW (A) 02/24/2021 0648   APPEARANCEUR CLEAR 02/24/2021 0648   LABSPEC 1.008 02/24/2021 0648   PHURINE 6.0 02/24/2021 0648   GLUCOSEU NEGATIVE 02/24/2021 0648   HGBUR NEGATIVE 02/24/2021 0648   BILIRUBINUR NEGATIVE 02/24/2021 0648   KETONESUR NEGATIVE 02/24/2021 0648   PROTEINUR NEGATIVE 02/24/2021 0648   NITRITE NEGATIVE 02/24/2021 0648   LEUKOCYTESUR NEGATIVE 02/24/2021 0648   Sepsis Labs Invalid  input(s): PROCALCITONIN,  WBC,  LACTICIDVEN Microbiology Recent Results (from the past 240 hour(s))  Resp Panel by RT-PCR (Flu A&B, Covid) Nasopharyngeal Swab     Status: None   Collection Time: 02/24/21  5:29 AM   Specimen: Nasopharyngeal Swab; Nasopharyngeal(NP) swabs in vial transport medium  Result Value Ref Range Status   SARS Coronavirus 2 by RT PCR NEGATIVE NEGATIVE Final    Comment: (NOTE) SARS-CoV-2 target nucleic acids are NOT DETECTED.  The SARS-CoV-2 RNA is generally detectable in upper respiratory specimens during the acute phase of infection. The lowest concentration of SARS-CoV-2 viral copies this assay can detect is 138 copies/mL. A negative result does not preclude SARS-Cov-2 infection and should not be used as the sole basis for treatment or other patient management decisions. A negative result may occur with  improper specimen collection/handling, submission of specimen other than nasopharyngeal swab, presence of viral mutation(s) within the areas targeted by this assay, and inadequate number of viral copies(<138 copies/mL). A negative result must be combined with clinical observations, patient history, and epidemiological information. The expected result is Negative.  Fact Sheet for Patients:  EntrepreneurPulse.com.au  Fact Sheet for Healthcare Providers:  IncredibleEmployment.be  This test is no t yet approved or cleared  by the [place] and  has been authorized for detection and/or diagnosis of SARS-CoV-2 by FDA under an Emergency Use Authorization (EUA). This EUA will remain  in effect (meaning this test can be used) for the duration of the COVID-19 declaration under Section 564(b)(1) of the Act, 21 U.S.C.section 360bbb-3(b)(1), unless the authorization is terminated  or revoked sooner.       Influenza A by PCR NEGATIVE NEGATIVE Final   Influenza B by PCR NEGATIVE NEGATIVE Final    Comment: (NOTE) The Xpert  Xpress SARS-CoV-2/FLU/RSV plus assay is intended as an aid in the diagnosis of influenza from Nasopharyngeal swab specimens and should not be used as a sole basis for treatment. Nasal washings and aspirates are unacceptable for Xpert Xpress SARS-CoV-2/FLU/RSV testing.  Fact Sheet for Patients: EntrepreneurPulse.com.au  Fact Sheet for Healthcare Providers: IncredibleEmployment.be  This test is not yet approved or cleared by the [place] FDA and has been authorized for detection and/or diagnosis of SARS-CoV-2 by FDA under an Emergency Use Authorization (EUA). This EUA will remain in effect (meaning this test can be used) for the duration of the COVID-19 declaration under Section 564(b)(1) of the Act, 21 U.S.C. section 360bbb-3(b)(1), unless the authorization is terminated or revoked.  Performed at [facility], [place] [address], [place], [place] [address]   MRSA Next Gen by PCR, Nasal     Status: None   Collection Time: 02/24/21  6:15 PM   Specimen: Nasal Mucosa; Nasal Swab  Result Value Ref Range Status   MRSA by PCR Next Gen NOT DETECTED NOT DETECTED Final    Comment: (NOTE) The GeneXpert MRSA Assay (FDA approved for NASAL specimens only), is one component of a comprehensive MRSA colonization surveillance program. It is not intended to diagnose MRSA infection nor to guide or monitor treatment for MRSA infections. Test performance is not FDA approved in patients less than 21 years old. Performed at [facility], [place] [address], [place], [place] [address]      Time coordinating discharge: Over 30 minutes  SIGNED:   Charlynne Cousins, MD  [facility] 02/25/2021, 8:24 AM Pager   If 7PM-7AM, please contact night-coverage [email or web address] Password [email or web address]

## 2021-02-25 NOTE — Progress Notes (Signed)
Progress Note  Patient Name: Dylan Hale Date of Encounter: 02/25/2021  [facility] Cardiologist: New  Subjective   No CP or dyspnea  Inpatient Medications    Scheduled Meds:  apixaban  5 mg Oral BID   clonazePAM  0.25 mg Oral BID   folic acid  1 mg Oral Daily   levothyroxine  75 mcg Oral Q0600   multivitamin with minerals  1 tablet Oral Daily   PARoxetine  30 mg Oral Daily   tamsulosin  0.4 mg Oral Daily   thiamine  100 mg Oral Daily   Or   thiamine  100 mg Intravenous Daily   Continuous Infusions:  diltiazem (CARDIZEM) infusion Stopped (02/24/21 2250)   PRN Meds: acetaminophen, LORazepam **OR** LORazepam, ondansetron (ZOFRAN) IV   Vital Signs    Vitals:   02/24/21 1816 02/24/21 1935 02/24/21 2345 02/25/21 0431  BP: (!) 110/97 108/71 (!) 98/59 102/70  Pulse: 69 (!) 51 (!) 52 80  Resp: 14 18 14 13   Temp: 98.3 F (36.8 C) 98.2 F (36.8 C) 98.3 F (36.8 C) 98.6 F (37 C)  TempSrc: Oral Oral Oral Oral  SpO2: 94% 95% 99% 100%  Weight: 82.5 kg     Height: 5\' 10"  (1.778 m)       Intake/Output Summary (Last 24 hours) at 02/25/2021 0713 Last data filed at 02/24/2021 2345 Gross per 24 hour  Intake 221.82 ml  Output --  Net 221.82 ml   Last 3 Weights 02/24/2021 02/24/2021 05/11/2017  Weight (lbs) 181 lb 12.8 oz 180 lb 185 lb  Weight (kg) 82.464 kg 81.647 kg 83.915 kg      Telemetry    Atrial fibrillation converting to sinus- Personally Reviewed  Physical Exam   GEN: No acute distress.   Neck: No JVD Cardiac: RRR, no murmurs, rubs, or gallops.  Respiratory: Clear to auscultation bilaterally. GI: Soft, nontender, non-distended  MS: No edema Neuro:  Nonfocal  Psych: Normal affect   Labs    High Sensitivity Troponin:   Recent Labs  Lab 02/24/21 0220 02/24/21 0430  TROPONINIHS 8 19*      Chemistry Recent Labs  Lab 02/24/21 0220 02/25/21 0009  NA 142 138  K 3.9 3.7  CL 110 107  CO2 23 26  GLUCOSE 100* 106*  BUN 18 25*  CREATININE 1.16  1.55*  CALCIUM 8.7* 9.0  PROT  --  5.8*  ALBUMIN  --  3.4*  AST  --  16  ALT  --  22  ALKPHOS  --  66  BILITOT  --  0.7  GFRNONAA >60 48*  ANIONGAP 9 5     Hematology Recent Labs  Lab 02/24/21 0220 02/25/21 0009  WBC 6.1 8.2  RBC 4.92 4.88  HGB 16.0 16.0  HCT 47.1 45.2  MCV 95.7 92.6  MCH 32.5 32.8  MCHC 34.0 35.4  RDW 12.1 12.1  PLT 207 225     DDimer  Recent Labs  Lab 02/24/21 0639  DDIMER 0.29     Radiology    CT HEAD WO CONTRAST  Result Date: 02/24/2021 CLINICAL DATA:  Head and neck trauma. EXAM: CT HEAD WITHOUT CONTRAST CT CERVICAL SPINE WITHOUT CONTRAST TECHNIQUE: Multidetector CT imaging of the head and cervical spine was performed following the standard protocol without intravenous contrast. Multiplanar CT image reconstructions of the cervical spine were also generated. COMPARISON:  None. FINDINGS: CT HEAD FINDINGS Brain: No evidence of acute infarction, hemorrhage, hydrocephalus, extra-axial collection or mass lesion/mass effect. Mild cerebral  atrophy. Vascular: No hyperdense vessel or unexpected calcification. Skull: Calvarium appears intact. Sinuses/Orbits: Mucosal thickening in the paranasal sinuses. No acute air-fluid levels. Mastoid air cells are clear. Other: None. CT CERVICAL SPINE FINDINGS Alignment: Straightening of usual cervical lordosis without anterior subluxation. This is likely positional but could indicate muscle spasm. Skull base and vertebrae: Skull base appears intact. No vertebral compression deformities. No focal bone lesion or bone destruction. Soft tissues and spinal canal: No prevertebral soft tissue swelling. No abnormal paraspinal soft tissue mass or infiltration. Disc levels: Degenerative changes with narrowed disc spaces and endplate osteophyte formation throughout. Degenerative changes in the facet joints. Uncovertebral spurring causes bone encroachment upon bilateral neural foramina. Upper chest: Lung apices are clear. Other: None.  IMPRESSION: 1. No acute intracranial abnormalities. 2. Nonspecific straightening of usual cervical lordosis. No acute displaced fractures identified. Degenerative changes. Electronically Signed   By: Lucienne Capers M.D.   On: 02/24/2021 03:42   CT CERVICAL SPINE WO CONTRAST  Result Date: 02/24/2021 CLINICAL DATA:  Head and neck trauma. EXAM: CT HEAD WITHOUT CONTRAST CT CERVICAL SPINE WITHOUT CONTRAST TECHNIQUE: Multidetector CT imaging of the head and cervical spine was performed following the standard protocol without intravenous contrast. Multiplanar CT image reconstructions of the cervical spine were also generated. COMPARISON:  None. FINDINGS: CT HEAD FINDINGS Brain: No evidence of acute infarction, hemorrhage, hydrocephalus, extra-axial collection or mass lesion/mass effect. Mild cerebral atrophy. Vascular: No hyperdense vessel or unexpected calcification. Skull: Calvarium appears intact. Sinuses/Orbits: Mucosal thickening in the paranasal sinuses. No acute air-fluid levels. Mastoid air cells are clear. Other: None. CT CERVICAL SPINE FINDINGS Alignment: Straightening of usual cervical lordosis without anterior subluxation. This is likely positional but could indicate muscle spasm. Skull base and vertebrae: Skull base appears intact. No vertebral compression deformities. No focal bone lesion or bone destruction. Soft tissues and spinal canal: No prevertebral soft tissue swelling. No abnormal paraspinal soft tissue mass or infiltration. Disc levels: Degenerative changes with narrowed disc spaces and endplate osteophyte formation throughout. Degenerative changes in the facet joints. Uncovertebral spurring causes bone encroachment upon bilateral neural foramina. Upper chest: Lung apices are clear. Other: None. IMPRESSION: 1. No acute intracranial abnormalities. 2. Nonspecific straightening of usual cervical lordosis. No acute displaced fractures identified. Degenerative changes. Electronically Signed   By:  Lucienne Capers M.D.   On: 02/24/2021 03:42   DG Chest Port 1 View  Result Date: 02/24/2021 CLINICAL DATA:  Fall EXAM: PORTABLE CHEST 1 VIEW COMPARISON:  06/20/2013 FINDINGS: The heart size and mediastinal contours are within normal limits. Both lungs are clear. The visualized skeletal structures are unremarkable. IMPRESSION: No active disease. Electronically Signed   By: Ulyses Jarred M.D.   On: 02/24/2021 02:57   ECHOCARDIOGRAM COMPLETE  Result Date: 02/24/2021    ECHOCARDIOGRAM REPORT   Patient Name:   LEVIN DAGOSTINO Date of Exam: 02/24/2021 Medical Rec #:  [id number]        Height:       70.0 in Accession #:    [id number]       Weight:       180.0 lb Date of Birth:  [birth year]        BSA:          1.996 m Patient Age:    36 years         BP:           128/76 mmHg Patient Gender: M  HR:           54 bpm. Exam Location:  Inpatient Procedure: 2D Echo Indications:    atrial fibrillation  History:        Patient has prior history of Echocardiogram examinations, most                 recent 05/02/2013. Signs/Symptoms:Syncope; Risk                 Factors:Dyslipidemia.  Sonographer:    Johny Chess Referring Phys: [id number] [place]  1. Left ventricular ejection fraction, by estimation, is 65 to 70%. The left ventricle has normal function. The left ventricle has no regional wall motion abnormalities. There is moderate left ventricular hypertrophy. Left ventricular diastolic parameters are consistent with Grade I diastolic dysfunction (impaired relaxation).  2. Right ventricular systolic function is normal. The right ventricular size is normal. Tricuspid regurgitation signal is inadequate for assessing PA pressure.  3. The mitral valve is abnormal. trivial to mild mitral valve regurgitation.  4. The aortic valve is tricuspid. Aortic valve regurgitation is mild. Mild aortic valve sclerosis is present, with no evidence of aortic valve stenosis.  5. The inferior vena cava is normal  in size with greater than 50% respiratory variability, suggesting right atrial pressure of 3 mmHg. FINDINGS  Left Ventricle: Left ventricular ejection fraction, by estimation, is 65 to 70%. The left ventricle has normal function. The left ventricle has no regional wall motion abnormalities. The left ventricular internal cavity size was normal in size. There is  moderate left ventricular hypertrophy. Left ventricular diastolic parameters are consistent with Grade I diastolic dysfunction (impaired relaxation). Indeterminate filling pressures. Right Ventricle: The right ventricular size is normal. No increase in right ventricular wall thickness. Right ventricular systolic function is normal. Tricuspid regurgitation signal is inadequate for assessing PA pressure. Left Atrium: Left atrial size was normal in size. Right Atrium: Right atrial size was normal in size. Pericardium: There is no evidence of pericardial effusion. Mitral Valve: The mitral valve is abnormal. There is mild thickening of the mitral valve leaflet(s). Trivial to mild mitral valve regurgitation. Tricuspid Valve: The tricuspid valve is grossly normal. Tricuspid valve regurgitation is not demonstrated. Aortic Valve: The aortic valve is tricuspid. Aortic valve regurgitation is mild. Aortic regurgitation PHT measures 713 msec. Mild aortic valve sclerosis is present, with no evidence of aortic valve stenosis. Pulmonic Valve: The pulmonic valve was normal in structure. Pulmonic valve regurgitation is not visualized. Aorta: The aortic root and ascending aorta are structurally normal, with no evidence of dilitation. Venous: The inferior vena cava is normal in size with greater than 50% respiratory variability, suggesting right atrial pressure of 3 mmHg. IAS/Shunts: No atrial level shunt detected by color flow Doppler.  LEFT VENTRICLE PLAX 2D LVOT diam:     2.10 cm  Diastology LV SV:         74       LV e' medial:    10.00 cm/s LV SV Index:   37       LV E/e'  medial:  7.1 LVOT Area:     3.46 cm LV e' lateral:   16.40 cm/s                         LV E/e' lateral: 4.3  RIGHT VENTRICLE             IVC RV S prime:     17.20 cm/s  IVC  diam: 1.80 cm TAPSE (M-mode): 2.3 cm LEFT ATRIUM             Index       RIGHT ATRIUM           Index LA Vol (A2C):   60.3 ml 30.21 ml/m RA Area:     17.40 cm LA Vol (A4C):   55.8 ml 27.96 ml/m RA Volume:   45.30 ml  22.70 ml/m LA Biplane Vol: 61.9 ml 31.01 ml/m  AORTIC VALVE LVOT Vmax:   101.00 cm/s LVOT Vmean:  62.100 cm/s LVOT VTI:    0.214 m AI PHT:      713 msec  AORTA Ao Root diam: 3.00 cm Ao Asc diam:  3.30 cm MITRAL VALVE MV Area (PHT): 3.89 cm    SHUNTS MV Decel Time: 195 msec    Systemic VTI:  0.21 m MV E velocity: 71.10 cm/s  Systemic Diam: 2.10 cm MV A velocity: 39.00 cm/s MV E/A ratio:  1.82 Lyman Bishop MD Electronically signed by Lyman Bishop MD Signature Date/Time: 02/24/2021/6:01:39 PM    Final      Patient Profile     70 y.o. male with past medical history of hyperlipidemia, hypothyroidism, alcohol abuse admitted with atrial fibrillation.  Echocardiogram showed ejection fraction 65 to 70%, moderate left ventricular hypertrophy, grade 1 diastolic dysfunction, mild aortic insufficiency.  TSH 2.381.  Assessment & Plan    1 paroxysmal atrial fibrillation-patient has converted to sinus rhythm.  Discontinue IV Cardizem.  We will treat with Toprol 12.5 mg daily (HR presently 52). Continue apixaban 5 mg twice daily.  Atrial fibrillation likely precipitated by excess alcohol.  If he maintains sinus rhythm over the next several months apixaban can likely be discontinued.  Note LV function is normal.  TSH is also normal.    2 alcohol abuse-we discussed previously the need to discontinue.  3 question syncope-not clear that patient had frank syncope and occurred in the early a.m. hours following alcohol use.  We will follow.  4 acute kidney injury-mildly increased this morning.  Etiology unclear.  Would plan to recheck  in approximately 1 week.  Patient can be discharged from cardiac standpoint on present medications.  Check BMET one week. Would arrange follow-up with APP in 4 to 6 weeks and me in 3 months.  We will sign off.  Please call with questions.  For questions or updates, please contact [place] Please consult [email or web address] for contact info under        Signed, Kirk Ruths, MD  02/25/2021, 7:13 AM

## 2021-02-26 ENCOUNTER — Telehealth: Payer: Self-pay | Admitting: Cardiology

## 2021-02-26 NOTE — Telephone Encounter (Signed)
Pt calling to discuss when it would be appropriate to not take his 12.5mg  Toprol. He was placed on this after a recent hospitalization of AFRVR. He states when he awoke this morning, his HR was in the mid to high 50's. He was not dizzy, but has some SOB which is normal for him.   We discussed HR naturally being lower in the morning. He states his normal HR is in the 60s. I advised him to check his HR after he has been up and moving for the day. If he normally is in the 60's, it would be safe for him to take his metoprolol as prescribed. I advised him to hold it if he is lower than 55bpm after he is up for the day.  In addition, his PCP ordered him a OSA test which was declined by his insurance company. I recommended he call his insurance company to inquire what type of OSA testing they pay for.  Will forward to MD for review and additional recommendation.

## 2021-02-26 NOTE — Telephone Encounter (Signed)
Patient was d/c from the hospital 02/25/21. Patient called to make f/u visits and ask questions   Pt c/o medication issue:  1. Name of Medication: metoprolol tartrate (LOPRESSOR) 25 MG tablet  2. How are you currently taking this medication (dosage and times per day)? Patient was on this medication IV in the hospital, but his HR went low (50s ) and it was taken out of his IV.   3. Are you having a reaction (difficulty breathing--STAT)?   4. What is your medication issue? Low HR. PT wanted to know if there are any times when he should or shouldn't be taking this medication  STAT if HR is under 50 or over 120 (normal HR is 60-100 beats per minute)  What is your heart rate? 60  Do you have a log of your heart rate readings (document readings)? no  Do you have any other symptoms? SOB  Pt c/o Shortness Of Breath: STAT if SOB developed within the last 24 hours or pt is noticeably SOB on the phone  1. Are you currently SOB (can you hear that pt is SOB on the phone)? no  2. How long have you been experiencing SOB? Off and on a while   3. Are you SOB when sitting or when up moving around? Started when he woke up but resolved itself after getting up and moving around   4. Are you currently experiencing any other symptoms? Low HR   Patient said his PCP recommended a sleep study. The low HR and  SOB upon waking up are all things that his PCP said could be fixed with a CPAP machine. His insurance was denied before, but he is hoping that if a Cardiologist were to order the test it may be approved

## 2021-02-27 NOTE — Telephone Encounter (Signed)
Attempted to contact patient, unable to leave a message at this time due to no VM. Will try again at a later time.

## 2021-02-28 NOTE — Telephone Encounter (Signed)
Spoke with pt, Aware of dr crenshaw's recommendations.  °

## 2021-03-01 ENCOUNTER — Other Ambulatory Visit (HOSPITAL_BASED_OUTPATIENT_CLINIC_OR_DEPARTMENT_OTHER): Payer: Self-pay

## 2021-03-01 DIAGNOSIS — R400 Somnolence: Secondary | ICD-10-CM

## 2021-03-06 ENCOUNTER — Telehealth: Payer: Self-pay | Admitting: Cardiology

## 2021-03-06 DIAGNOSIS — I4891 Unspecified atrial fibrillation: Secondary | ICD-10-CM

## 2021-03-06 NOTE — Telephone Encounter (Signed)
Spoke with pt, Aware of dr Jacalyn Lefevre recommendations. He does not want to see the atrial fib clinic. He would like a referral to dr Quentin Ore. Referral placed.

## 2021-03-06 NOTE — Telephone Encounter (Signed)
New message   Pt calling stating that his daughter thinks that he needs to see an EP MD because he was just in the hopsital for afib episdoes. He would like a referral to EP, he would like to see Dr. Quentin Ore. Please advise

## 2021-03-06 NOTE — Telephone Encounter (Signed)
This RN called patient back, pt had been in the hospital for atrial fibrillation and had seen Dr. Stanford Breed in hospital. Pt is now home and has an appointment for follow up with Coletta Memos on 9/8. The pt was interested in getting a referral for EP because of having afib episodes. Pt reports he had an episode yesterday where he could feel his heart beating and felt tired. Pt reports he is taking his metoprolol and Eliquis as prescribed. Pt denies any symptoms at this time and reports he keeps track of his blood pressure and heart rate. This RN told patient his request would be sent to Dr. Stanford Breed for review. Pt verbalized understanding.

## 2021-03-14 ENCOUNTER — Ambulatory Visit (HOSPITAL_BASED_OUTPATIENT_CLINIC_OR_DEPARTMENT_OTHER): Payer: 59 | Attending: Family Medicine | Admitting: Internal Medicine

## 2021-03-14 ENCOUNTER — Other Ambulatory Visit: Payer: Self-pay

## 2021-03-14 VITALS — Ht 70.0 in | Wt 180.0 lb

## 2021-03-14 DIAGNOSIS — R400 Somnolence: Secondary | ICD-10-CM | POA: Diagnosis present

## 2021-03-14 DIAGNOSIS — G4733 Obstructive sleep apnea (adult) (pediatric): Secondary | ICD-10-CM | POA: Insufficient documentation

## 2021-03-20 ENCOUNTER — Other Ambulatory Visit: Payer: Self-pay | Admitting: *Deleted

## 2021-03-20 ENCOUNTER — Encounter (HOSPITAL_COMMUNITY): Payer: Self-pay | Admitting: *Deleted

## 2021-03-20 DIAGNOSIS — R079 Chest pain, unspecified: Secondary | ICD-10-CM

## 2021-03-20 MED ORDER — METOPROLOL TARTRATE 25 MG PO TABS: 12.5000 mg | ORAL_TABLET | Freq: Every day | ORAL | 1 refills | Status: DC

## 2021-03-20 MED ORDER — APIXABAN 5 MG PO TABS: 5.0000 mg | ORAL_TABLET | Freq: Two times a day (BID) | ORAL | 5 refills | Status: AC

## 2021-03-20 NOTE — Telephone Encounter (Signed)
Prescription refill request for Eliquis received. Indication: Afib Last office visit: Pt seen is Hospital on 02/24/21 by Dr Stanford Breed. Will have follow-up appt with Dr Quentin Ore on 04/23/21 and [place] on 05/02/21. Approved to send per [facility] PharmD Scr: 1.55 Age: 70 Weight: 81.6kg

## 2021-03-20 NOTE — Telephone Encounter (Signed)
Call placed to pt.  He has been made aware that Dr. Quentin Ore has agreed to order his Stress test before his appointment with him 04/23/2021.    Pt has been scheduled for 03/27/2021 @ 7:45.  Pt aware I will send his instructions via mychart.  Pt was so appreciative of the call back.  Pt also requesting refill of Metoprolol and Eliquis.  I have sent in the Metoprolol and will route the Eliquis to the Anticoagulation pool.

## 2021-03-21 ENCOUNTER — Other Ambulatory Visit: Payer: Self-pay | Admitting: *Deleted

## 2021-03-21 DIAGNOSIS — R072 Precordial pain: Secondary | ICD-10-CM

## 2021-03-23 DIAGNOSIS — R400 Somnolence: Secondary | ICD-10-CM | POA: Diagnosis not present

## 2021-03-23 NOTE — Procedures (Signed)
     Patient Name: Dylan Hale, Dylan Hale Date: 03/14/2021 Gender: Male D.O.B: 1951/07/24 Age (years): 46 Referring Provider: Not Available Height (inches): 70 Interpreting Physician: Baird Lyons MD, ABSM Weight (lbs): 180 RPSGT: [place], Holly BMI: 26 MRN: [id number] Neck Size: 17.50  CLINICAL INFORMATION Sleep Study Type: HST Indication for sleep study: OSA, A Fib Epworth Sleepiness Score: 11  SLEEP STUDY TECHNIQUE A multi-channel overnight portable sleep study was performed. The channels recorded were: nasal airflow, thoracic respiratory movement, and oxygen saturation with a pulse oximetry. Snoring was also monitored.  MEDICATIONS Patient self administered medications include: none reported.  SLEEP ARCHITECTURE Patient was studied for 378.2 minutes. The sleep efficiency was 100.0 % and the patient was supine for 28.7%. The arousal index was 0.0 per hour.  RESPIRATORY PARAMETERS The overall AHI was 23.8 per hour, with a central apnea index of 0 per hour. The oxygen nadir was 84% during sleep.  CARDIAC DATA Mean heart rate during sleep was 44.6 bpm.  IMPRESSIONS - Moderate obstructive sleep apnea occurred during this study (AHI = 23.8/h). - Moderate oxygen desaturation was noted during this study (Min O2 = 84%). Mean O2 saturation 92%. - Patient snored.  DIAGNOSIS - Obstructive Sleep Apnea (G47.33)  RECOMMENDATIONS - Suggest return for CPAP titration sleep study or autopap. Other options including management consultation would be based on clinical judgment. - Be careful with alcohol, sedatives and other CNS depressants that may worsen sleep apnea and disrupt normal sleep architecture. - Sleep hygiene should be reviewed to assess factors that may improve sleep quality. - Weight management and regular exercise should be initiated or continued.  [Electronically signed] 03/23/2021 10:46 AM  Baird Lyons MD, ABSM Diplomate, American Board of Sleep  Medicine   NPI: [id number]                        [place]  ELECTRONICALLY SIGNED ON:  03/23/2021, 10:44 AM [place] PH: [phone]   FX: [phone] [place]

## 2021-03-25 ENCOUNTER — Telehealth (HOSPITAL_COMMUNITY): Payer: Self-pay | Admitting: *Deleted

## 2021-03-25 NOTE — Telephone Encounter (Signed)
Attempted to call patient regarding upcoming appointment- no answer, letter sent with instructions via my chart.  Dylan Hale

## 2021-03-26 ENCOUNTER — Telehealth: Payer: Self-pay | Admitting: Cardiovascular Disease

## 2021-03-26 NOTE — Telephone Encounter (Signed)
Spoke to the patient about his Metoprolol. He is currently taking Metoprolol Tartrate 12.5 mg once daily.   He stated that he saw his PCP today and his PCP told him to stop the Metoprolol and only use it as needed. He was worried that his heart rate stays in the 40's and was concerned about the patient passing out.  The patient wants to know what Dr. Stanford Breed feels about stopping the metoprolol. He did say that he is aware when he is in afib so can take the Metoprolol as needed. He also monitors his heart rate daily.   Appointments: Dr. Quentin Ore on 04/23/21 Dr. Stanford Breed 11/3

## 2021-03-26 NOTE — Telephone Encounter (Signed)
New Message:     Patient said he saw his Primary Doctor today and he stopped his Metoprolol. He said his heart rate today was 42. Patient is very concerned about stopping the Metoprolol. His Primary Doctor told him he had any Afib to take the Metoprolol. Patient says his heart rate have always been low. He wants Dr C to advise him please.

## 2021-03-27 ENCOUNTER — Other Ambulatory Visit: Payer: Self-pay

## 2021-03-27 ENCOUNTER — Ambulatory Visit (HOSPITAL_COMMUNITY): Payer: 59 | Attending: Cardiology

## 2021-03-27 DIAGNOSIS — R079 Chest pain, unspecified: Secondary | ICD-10-CM | POA: Diagnosis present

## 2021-03-27 LAB — MYOCARDIAL PERFUSION IMAGING
Estimated workload: 10.1 METS
Exercise duration (min): 9 min
Exercise duration (sec): 0 s
LV dias vol: 71 mL (ref 62–150)
LV sys vol: 27 mL
MPHR: 151 {beats}/min
Peak HR: 153 {beats}/min
Percent HR: 101 %
Rest HR: 50 {beats}/min
SDS: 2
SRS: 0
SSS: 2
TID: 0.71

## 2021-03-27 MED ORDER — TECHNETIUM TC 99M TETROFOSMIN IV KIT
29.6000 | PACK | Freq: Once | INTRAVENOUS | Status: AC | PRN
  Administered 2021-03-27: 29.6 via INTRAVENOUS
  Filled 2021-03-27: qty 30

## 2021-03-27 MED ORDER — METOPROLOL TARTRATE 25 MG PO TABS: 12.5000 mg | ORAL_TABLET | Freq: Every day | ORAL | 1 refills | Status: DC | PRN

## 2021-03-27 MED ORDER — TECHNETIUM TC 99M TETROFOSMIN IV KIT
10.8000 | PACK | Freq: Once | INTRAVENOUS | Status: AC | PRN
  Administered 2021-03-27: 10.8 via INTRAVENOUS
  Filled 2021-03-27: qty 11

## 2021-03-27 NOTE — Telephone Encounter (Signed)
Spoke with pt, Aware of dr crenshaw's recommendations.  °

## 2021-04-23 ENCOUNTER — Ambulatory Visit: Payer: 59 | Admitting: Cardiology

## 2021-04-23 ENCOUNTER — Other Ambulatory Visit: Payer: Self-pay

## 2021-04-23 VITALS — BP 128/64 | HR 66 | Ht 70.5 in | Wt 183.0 lb

## 2021-04-23 DIAGNOSIS — I480 Paroxysmal atrial fibrillation: Secondary | ICD-10-CM

## 2021-04-23 DIAGNOSIS — Z7289 Other problems related to lifestyle: Secondary | ICD-10-CM

## 2021-04-23 DIAGNOSIS — Z789 Other specified health status: Secondary | ICD-10-CM

## 2021-04-23 NOTE — Progress Notes (Signed)
Electrophysiology Office Note:    Date:  04/23/2021   ID:  Dylan Hale, DOB 07-Jan-1951, MRN [id number]  PCP:  Bayard Beaver., MD   [facility] Electrophysiologist:  Vickie Epley, MD   Referring MD: Lelon Perla, MD   Chief Complaint: Atrial fibrillation  History of Present Illness:    Dylan Hale is a 70 y.o. male who presents for an evaluation of atrial fibrillation at the request of Dr. Stanford Breed and Dr. Rafael Bihari. Their medical history includes hypothyroidism, hyperlipidemia.  The patient was seen by Dr. Rafael Bihari on March 09, 2021 at [facility] in [place].  The patient is maintained on Eliquis for stroke prophylaxis.  The patient presented to the emergency department on February 24, 2021 in [place] after a syncopal episode.  He was found to be in atrial fibrillation with RVR.  He was ultimately cardioverted using IV Cardizem.  He has been drinking more than usual in the days prior to his presentation because of increased stress.  The day of his presentation he had had several beers and shots at a bar with friends.  A cardiac monitor was ordered by Dr. Rafael Bihari which showed 4 episodes of atrial fibrillation.  Past Medical History:  Diagnosis Date   Anxiety    Chest pain    Depression    Enlarged prostate    Hyperlipidemia    Hypogonadism male    Hypothyroidism    Plantar fibromatosis    right foot    Past Surgical History:  Procedure Laterality Date   MASS EXCISION Right 08/02/2015   Procedure: EXCISION OF RIGHT FOOT MASS;  Surgeon: Wylene Simmer, MD;  Location: [place];  Service: Orthopedics;  Laterality: Right;   SHOULDER SURGERY Left    Arthroscopic    Current Medications: Current Meds  Medication Sig   apixaban (ELIQUIS) 5 MG TABS tablet Take 1 tablet (5 mg total) by mouth 2 (two) times daily.   aspirin EC 81 MG tablet Take 81 mg by mouth daily.   Cholecalciferol (VITAMIN D-3) 1000 UNITS CAPS Take 2,000 Units by mouth daily.    clonazePAM (KLONOPIN) 0.5 MG tablet Take 0.25 mg by mouth 2 (two) times daily.   levothyroxine (SYNTHROID, LEVOTHROID) 75 MCG tablet Take 75 mcg by mouth daily.   Omega-3 Fatty Acids (FISH OIL) 1000 MG CAPS Take by mouth.   PARoxetine (PAXIL) 30 MG tablet Take 30 mg by mouth daily.   tamsulosin (FLOMAX) 0.4 MG CAPS capsule Take 0.4 mg by mouth daily.   Testosterone 20.25 MG/ACT (1.62%) GEL Apply 2 Pump topically every morning. To each underarm after shower     Allergies:   Sudafed [pseudoephedrine]   Social History   Socioeconomic History   Marital status: Married    Spouse name: Not on file   Number of children: 4   Years of education: Not on file   Highest education level: Not on file  Occupational History   Not on file  Tobacco Use   Smoking status: Never   Smokeless tobacco: Never  Vaping Use   Vaping Use: Never used  Substance and Sexual Activity   Alcohol use: Yes    Alcohol/week: 2.0 - 4.0 standard drinks    Types: 2 - 4 Standard drinks or equivalent per week   Drug use: No   Sexual activity: Yes  Other Topics Concern   Not on file  Social History Narrative   Not on file   Social Determinants of Health   Financial Resource  Strain: Not on file  Food Insecurity: Not on file  Transportation Needs: Not on file  Physical Activity: Not on file  Stress: Not on file  Social Connections: Not on file     Family History: The patient's family history includes Anxiety disorder in his mother; Autoimmune disease in his mother; Colon cancer in his maternal grandmother; Emphysema in his maternal grandfather; Lung cancer in his paternal grandfather; Prostate cancer in his father.  ROS:   Please see the history of present illness.    All other systems reviewed and are negative.  EKGs/Labs/Other Studies Reviewed:    The following studies were reviewed today:  March 27, 2021 ZIO monitor results 1% burden of atrial fibrillation.  There were 4 episodes of A. fib.  EKG:   The ekg ordered today demonstrates sinus rhythm.  Ventricular rate of 66 bpm.  Recent Labs: 02/24/2021: TSH 2.381 02/25/2021: ALT 22; BUN 25; Creatinine, Ser 1.55; Hemoglobin 16.0; Magnesium 2.0; Platelets 225; Potassium 3.7; Sodium 138  Recent Lipid Panel No results found for: CHOL, TRIG, HDL, CHOLHDL, VLDL, LDLCALC, LDLDIRECT  Physical Exam:    VS:  BP 128/64   Pulse 66   Ht 5' 10.5" (1.791 m)   Wt 183 lb (83 kg)   BMI 25.89 kg/m     Wt Readings from Last 3 Encounters:  04/23/21 183 lb (83 kg)  03/27/21 180 lb (81.6 kg)  03/14/21 180 lb (81.6 kg)     GEN:  Well nourished, well developed in no acute distress HEENT: Normal NECK: No JVD; No carotid bruits LYMPHATICS: No lymphadenopathy CARDIAC: RRR, no murmurs, rubs, gallops RESPIRATORY:  Clear to auscultation without rales, wheezing or rhonchi  ABDOMEN: Soft, non-tender, non-distended MUSCULOSKELETAL:  No edema; No deformity  SKIN: Warm and dry NEUROLOGIC:  Alert and oriented x 3 PSYCHIATRIC:  Normal affect   ASSESSMENT:    1. Paroxysmal A-fib ([place])   2. Alcohol use    PLAN:    In order of problems listed above:  1. Paroxysmal A-fib (H[facility]) Symptomatic.  Overall fairly low burden with 1% burden (4 episodes (on recent Zio patch.  He is on Eliquis for stroke prophylaxis.  I discussed the various treatment options available to him including continued conservative management and monitoring of the overall burden of his atrial fibrillation.  I discussed antiarrhythmic drug therapy including flecainide, Multaq, dronedarone and amiodarone.  I think I would start with flecainide pending the results of his recent stress test.  I discussed the efficacy of antiarrhythmic drug therapy compared to ablation.  I discussed the recovery and risks of ablation therapy.  I think overall he is a good candidate for ablation but he is not interested in a invasive approach to rhythm control at this time.  For now, we will continue with conservative  therapy.  He will continue Eliquis for stroke prophylaxis.  We will plan to see him back in clinic in about 6 months to reassess the burden of his atrial fibrillation.  If he experiences an increased burden he will reach out to [place] to get an earlier appointment to discuss treatment options.  2. Alcohol use Cessation encouraged.    Total time spent with patient today 65 minutes. This includes reviewing records, evaluating the patient and coordinating care.  Medication Adjustments/Labs and Tests Ordered: Current medicines are reviewed at length with the patient today.  Concerns regarding medicines are outlined above.  Orders Placed This Encounter  Procedures   EKG 12-Lead   No orders of the  defined types were placed in this encounter.    Signed, Hilton Cork. Quentin Ore, MD, [facility], [facility] 04/23/2021 10:24 PM    Electrophysiology [facility]

## 2021-04-23 NOTE — Patient Instructions (Addendum)
Medication Instructions:  Your physician recommends that you continue on your current medications as directed. Please refer to the Current Medication list given to you today.  Labwork: None ordered.  Testing/Procedures: None ordered.  Follow-Up: Your physician wants you to follow-up in: 10/21/21 at 9:40 am Legrand Como "Jonni Sanger" Chalmers Cater, PA-C      Any Other Special Instructions Will Be Listed Below (If Applicable).  If you need a refill on your cardiac medications before your next appointment, please call your pharmacy.

## 2021-04-26 ENCOUNTER — Telehealth: Payer: Self-pay | Admitting: *Deleted

## 2021-04-26 ENCOUNTER — Other Ambulatory Visit: Payer: Self-pay | Admitting: Cardiology

## 2021-04-26 DIAGNOSIS — G4733 Obstructive sleep apnea (adult) (pediatric): Secondary | ICD-10-CM

## 2021-04-26 NOTE — Telephone Encounter (Signed)
-----   Message from Meryl Crutch, RN sent at 04/25/2021  9:19 AM EDT -----  ----- Message ----- From: Darrell Jewel, RN Sent: 04/23/2021   4:03 PM EDT To: Damian Leavell, RN, Meryl Crutch, RN  Hey,  The patient is seen by Dr. Stanford Breed. He saw Dr. Quentin Ore today for afib. The doctor asked that I reach out to Dr. Evette Georges team with sleep study. The patient had one already with his PCP at home. My understanding is the patient needs a machine with in lab titration and had difficultly with his insurance for approval. The home sleep study shows he does have OSA. Dr. Quentin Ore is requesting assistance to get this process started/completed.   Thank you Otila Kluver

## 2021-04-26 NOTE — Telephone Encounter (Signed)
Prior Authorization for CPAP titration sent to [facility] via web portal. Tracking Number [id number].

## 2021-05-02 ENCOUNTER — Ambulatory Visit: Payer: 59 | Admitting: General Practice

## 2021-05-03 ENCOUNTER — Telehealth: Payer: Self-pay

## 2021-05-03 NOTE — Telephone Encounter (Signed)
Call placed to Dr. Pasty Arch office to obtain additional information related to OSA.  Await records.

## 2021-05-10 NOTE — Telephone Encounter (Signed)
Checked [employer] web portal for status of CPAP titration PA request. Showing still in process.

## 2021-05-14 NOTE — Telephone Encounter (Signed)
Denial for CPAP titration received from [facility]. Will get APAP orders from Dr Claiborne Billings upon his return from vacation.

## 2021-05-15 ENCOUNTER — Telehealth: Payer: Self-pay | Admitting: Cardiology

## 2021-05-15 NOTE — Telephone Encounter (Signed)
     [facility] Pre-operative Risk Assessment    Patient Name: Dylan Hale  DOB: 1951/07/07 MRN: [id number]  HEARTCARE STAFF:  - IMPORTANT!!!!!! Under Visit Info/Reason for Call, type in Other and utilize the format Clearance MM/DD/YY or Clearance TBD. Do not use dashes or single digits. - Please review there is not already an duplicate clearance open for this procedure. - If request is for dental extraction, please clarify the # of teeth to be extracted. - If the patient is currently at the dentist's office, call Pre-Op Callback Staff (MA/nurse) to input urgent request.  - If the patient is not currently in the dentist office, please route to the Pre-Op pool.  Request for surgical clearance:  What type of surgery is being performed? Thyroid biopsy, ultrasound guided biopsy  When is this surgery scheduled? TBD  What type of clearance is required (medical clearance vs. Pharmacy clearance to hold med vs. Both)? Both  Are there any medications that need to be held prior to surgery and how long? Eliquis  Practice name and name of physician performing surgery? Dr. Armandina Gemma - [facility] [place] [facility]  What is the office phone number? [phone]   7.   What is the office fax number? [phone]  8.   Anesthesia type (None, local, MAC, general) ? Local    Angeline S Hammer 05/15/2021, 10:27 AM  _________________________________________________________________   (provider comments below)

## 2021-05-15 NOTE — Telephone Encounter (Signed)
   Patient Name: Dylan Hale  DOB: 08/12/1951 MRN: [id number]  Primary Cardiologist: Dr. Stanford Breed, EP - Dr. Quentin Ore (has also seen Dr. Rafael Bihari in [place])  Chart reviewed as part of pre-operative protocol coverage. Last echo 02/2021 EF 65-70%, grade 1 DD, mild AI. Nuclear stress test 03/2021 was low risk. Seen by Dr. Quentin Ore 04/23/21. Will route to pharm team re: anticoag then patient will need call. Assuming no interim issues requiring office attention should be OK to clear.   Charlie Pitter, PA-C 05/15/2021, 11:18 AM

## 2021-05-15 NOTE — Telephone Encounter (Signed)
Patient with diagnosis of afib on Eliquis for anticoagulation.    Procedure: thyroid biopsy Date of procedure: TBD  CHA2DS2-VASc Score = 1  This indicates a 0.6% annual risk of stroke. The patient's score is based upon: CHF History: 0 HTN History: 0 Diabetes History: 0 Stroke History: 0 Vascular Disease History: 0 Age Score: 1 Gender Score: 0    CrCl 66mL/min Platelet count 225K  Per office protocol, patient can hold Eliquis for 2 days prior to procedure.

## 2021-05-16 NOTE — Telephone Encounter (Signed)
Elmyra Ricks from [facility] would like clearance faxed to her @ [phone]

## 2021-05-21 ENCOUNTER — Other Ambulatory Visit: Payer: Self-pay | Admitting: Surgery

## 2021-05-21 DIAGNOSIS — E041 Nontoxic single thyroid nodule: Secondary | ICD-10-CM

## 2021-05-23 ENCOUNTER — Ambulatory Visit
Admission: RE | Admit: 2021-05-23 | Discharge: 2021-05-23 | Disposition: A | Discharge status: Home / Self Care | Payer: 59 | Source: Ambulatory Visit | Attending: Surgery | Admitting: Surgery

## 2021-05-23 ENCOUNTER — Other Ambulatory Visit (HOSPITAL_COMMUNITY)
Admission: RE | Admit: 2021-05-23 | Discharge: 2021-05-23 | Disposition: A | Discharge status: Home / Self Care | Payer: 59 | Source: Ambulatory Visit | Attending: Surgery | Admitting: Surgery

## 2021-05-23 DIAGNOSIS — E041 Nontoxic single thyroid nodule: Secondary | ICD-10-CM | POA: Diagnosis present

## 2021-05-24 ENCOUNTER — Telehealth: Payer: Self-pay | Admitting: *Deleted

## 2021-05-24 LAB — CYTOLOGY - NON PAP

## 2021-05-24 NOTE — Telephone Encounter (Signed)
My chart message sent to patient informing him we will defer his CPAP order to [facility] unless otherwise informed by him.

## 2021-05-24 NOTE — Telephone Encounter (Signed)
APAP order was sent in by [facility]. Will not proceed with our order at this time.

## 2021-06-04 NOTE — Progress Notes (Signed)
FNA biopsy result is benign.  Recommend continued follow up with repeat USN and TSH level in one year, followed by office visit for physical exam and discussion.  Claiborne Billings - please arrange.  No surgery indicated at this time.  [place], MD [facility] [place] practice Office: [phone]

## 2021-06-04 NOTE — Telephone Encounter (Signed)
Pt following with [facility] for OSA tx.  No action needed.

## 2021-06-27 ENCOUNTER — Ambulatory Visit: Payer: 59 | Admitting: Cardiology

## 2021-07-16 ENCOUNTER — Telehealth: Payer: Self-pay

## 2021-07-16 NOTE — Telephone Encounter (Signed)
Call placed to Dr. Nigel Berthold office.  Left detailed message for Medical Records requesting stress test be faxed to this office.

## 2021-07-16 NOTE — Telephone Encounter (Signed)
Call back received from [place] Records.  Per Medical Records, Pt did not complete the stress test.  Per Dr. Prentice Docker exercise nuclear stress test.  Sent mychart to Pt advising of need for stress test.

## 2021-08-06 ENCOUNTER — Telehealth: Payer: Self-pay

## 2021-08-06 NOTE — Telephone Encounter (Signed)
Opened in error

## 2021-09-15 IMAGING — CT CT CERVICAL SPINE W/O CM
3 of 4 series · 12 of 33 positions shown, 14 images · non-contrast
Comparison: None.

CLINICAL DATA: Head and neck trauma.

EXAM:
CT HEAD WITHOUT CONTRAST
CT CERVICAL SPINE WITHOUT CONTRAST
TECHNIQUE: Multidetector CT imaging of the head and cervical spine was
performed following the standard protocol without intravenous
contrast. Multiplanar CT image reconstructions of the cervical spine
were also generated.

[Series 8: sag bone · sagittal · 0.30mm/px · 5 of 84 slices shown, 6 images]
[im 28/84  bone]
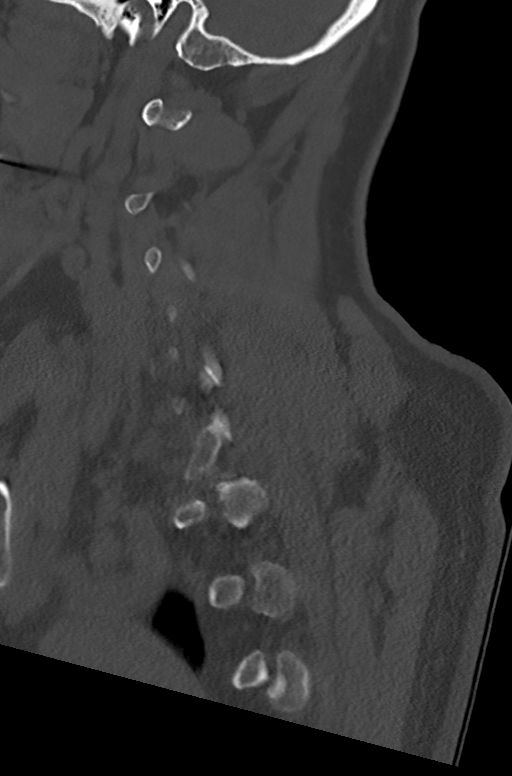
[im 35/84  bone]
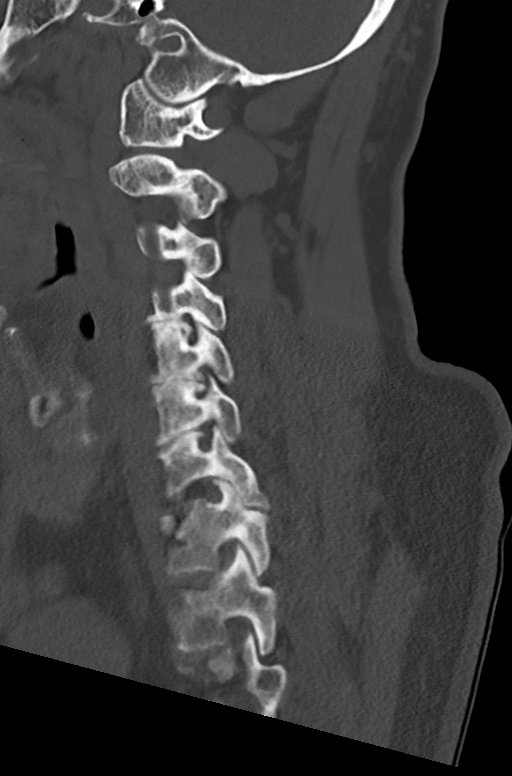
[im 42/84  soft-tissue]
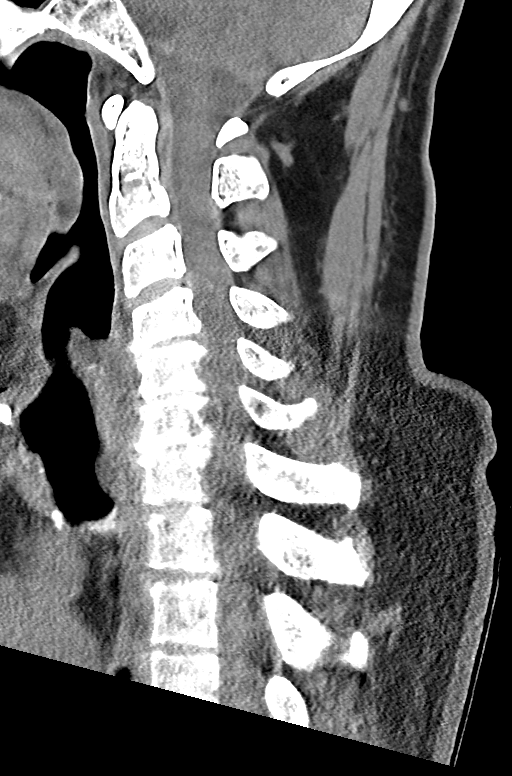
[im 42/84  bone]
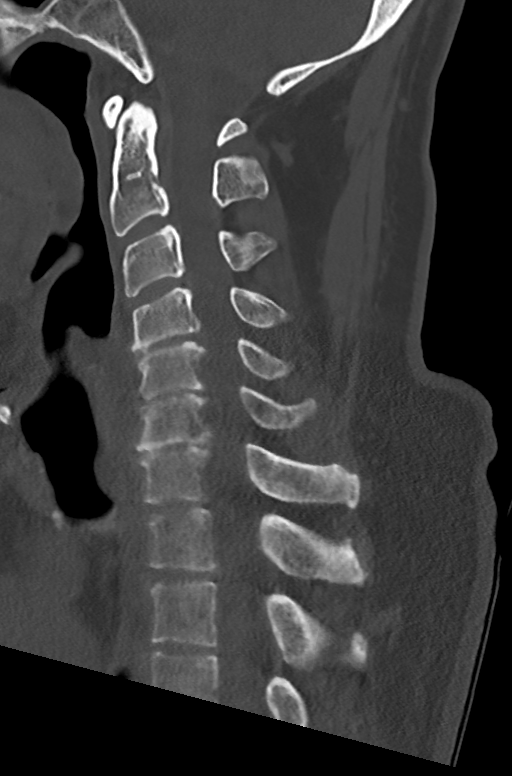
[im 49/84  bone]
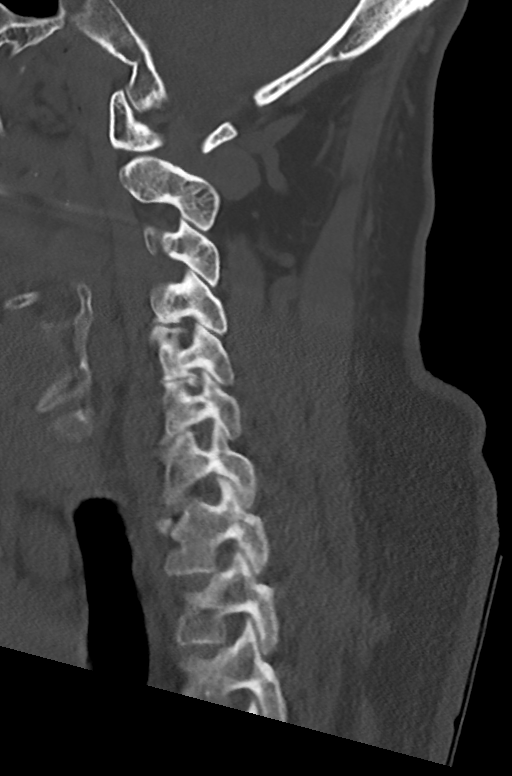
[im 56/84  bone]
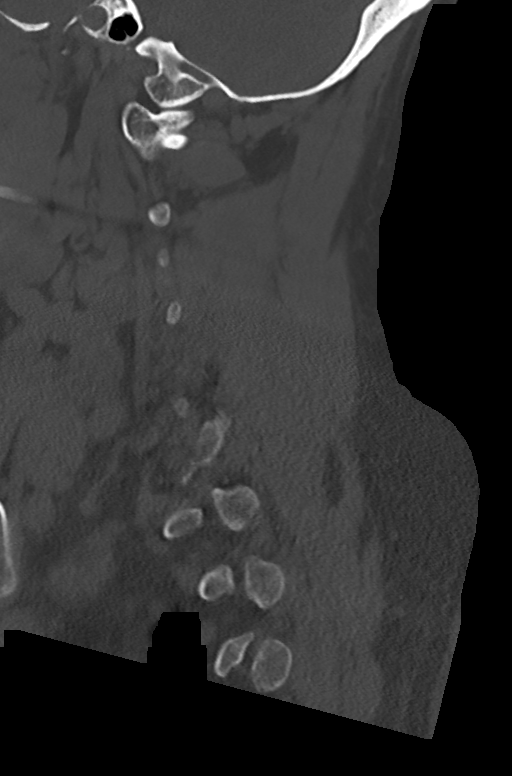

[Series 9: cor bone · coronal · 0.30mm/px · 3 of 75 slices shown]
[im 15/75  bone]
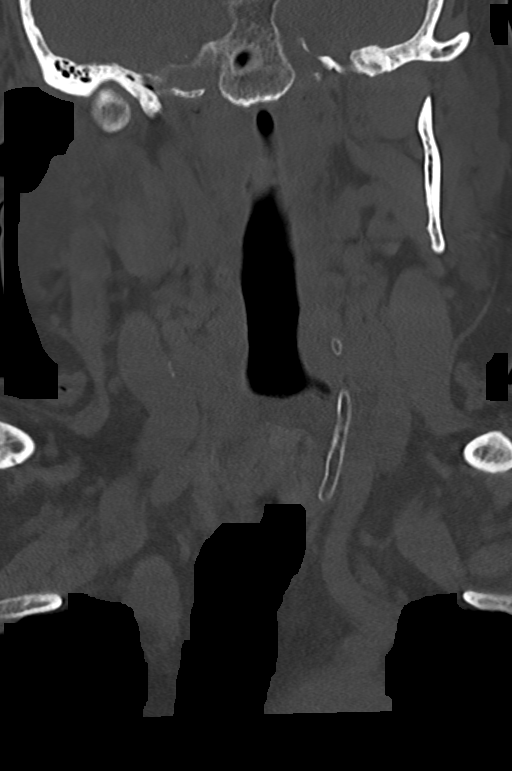
[im 30/75  bone]
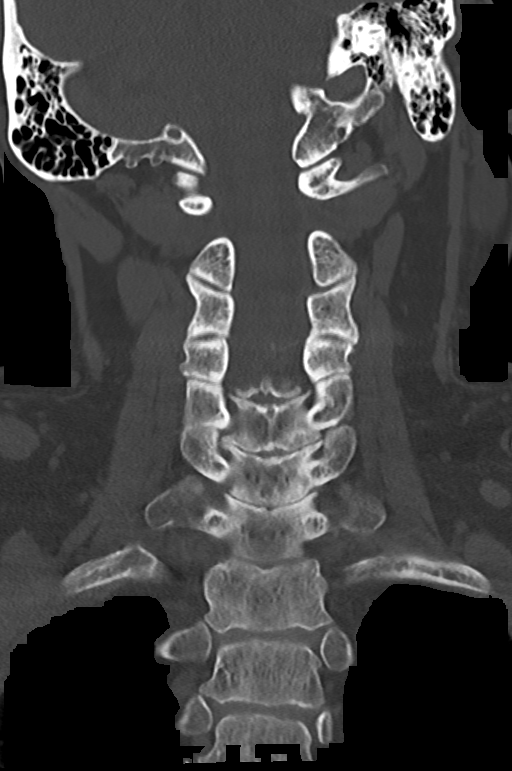
[im 45/75  bone]
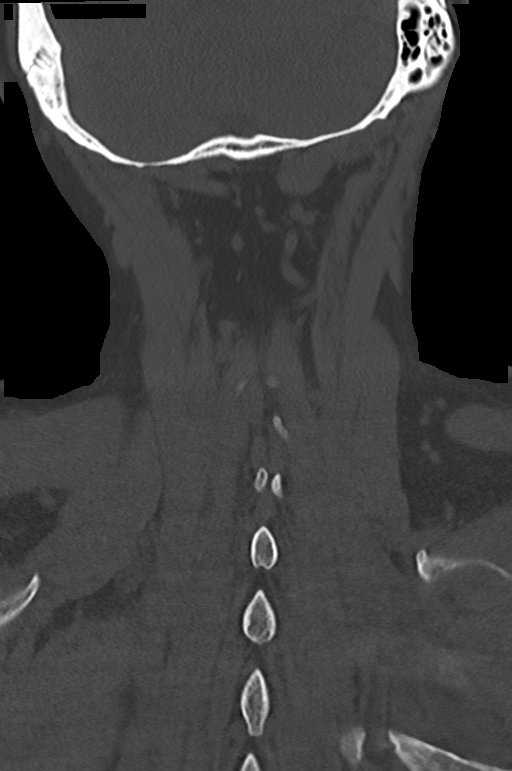

[Series 10: orthogonal axials · axial · 0.21mm/px · z∈[+770,+874]mm · 4 of 100 slices shown, 5 images]
[im 17/100  soft-tissue]
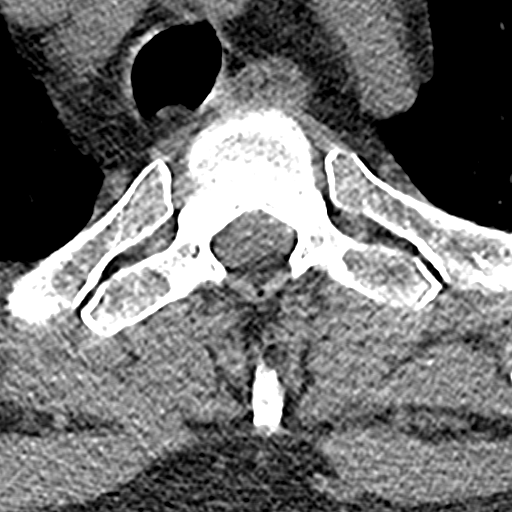
[im 17/100  bone]
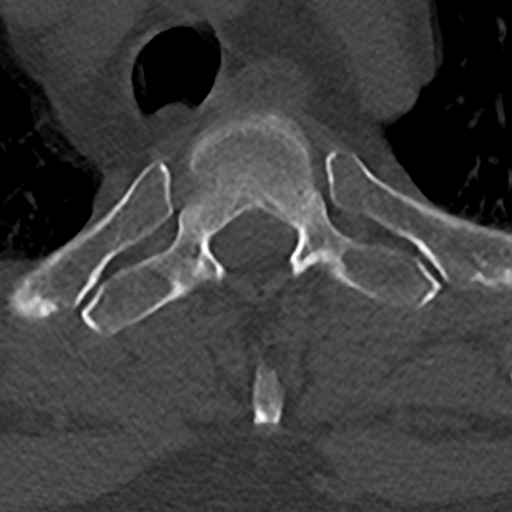
[im 34/100  bone]
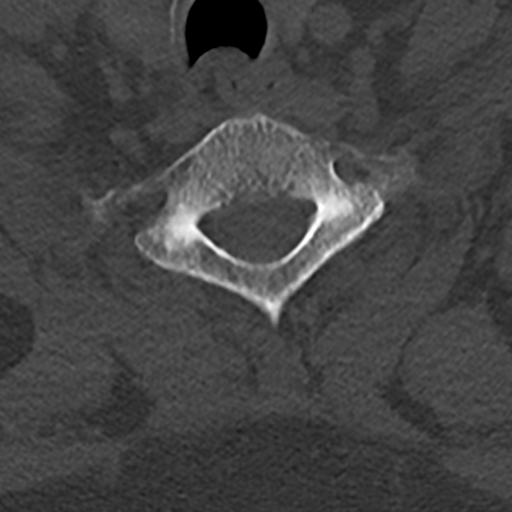
[im 67/100  bone]
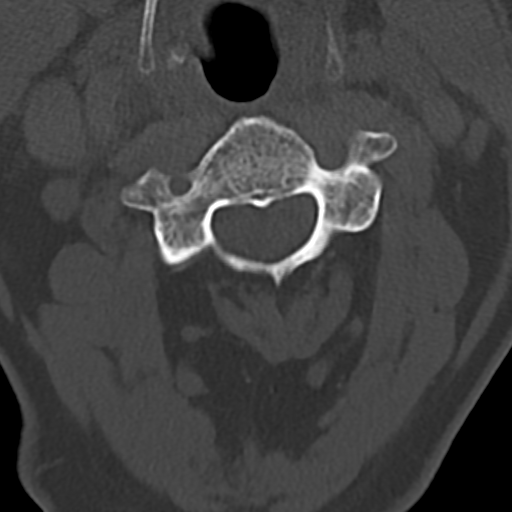
[im 83/100  bone]
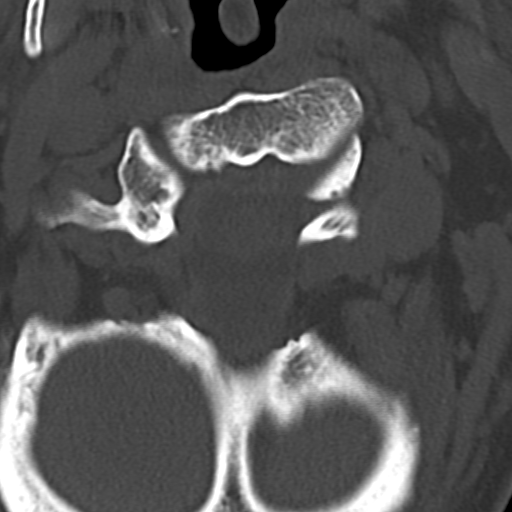

[12 of 33 positions shown; findings below may reference images not displayed]

FINDINGS: CT HEAD FINDINGS

Brain: No evidence of acute infarction, hemorrhage, hydrocephalus,
extra-axial collection or mass lesion/mass effect. Mild cerebral
atrophy.

Vascular: No hyperdense vessel or unexpected calcification.

Skull: Calvarium appears intact.

Sinuses/Orbits: Mucosal thickening in the paranasal sinuses. No
acute air-fluid levels. Mastoid air cells are clear.

Other: None.

CT CERVICAL SPINE FINDINGS

Alignment: Straightening of usual cervical lordosis without anterior
subluxation. This is likely positional but could indicate muscle
spasm.

Skull base and vertebrae: Skull base appears intact. No vertebral
compression deformities. No focal bone lesion or bone destruction.

Soft tissues and spinal canal: No prevertebral soft tissue swelling.
No abnormal paraspinal soft tissue mass or infiltration.

Disc levels: Degenerative changes with narrowed disc spaces and
endplate osteophyte formation throughout. Degenerative changes in
the facet joints. Uncovertebral spurring causes bone encroachment
upon bilateral neural foramina.

Upper chest: Lung apices are clear.

Other: None.
IMPRESSION: 1. No acute intracranial abnormalities.
2. Nonspecific straightening of usual cervical lordosis. No acute
displaced fractures identified. Degenerative changes.

## 2021-10-08 ENCOUNTER — Telehealth: Payer: Self-pay | Admitting: Cardiology

## 2021-10-08 NOTE — Telephone Encounter (Signed)
Patient c/o Palpitations:  High priority if patient c/o lightheadedness, shortness of breath, or chest pain  How long have you had palpitations/irregular HR/ Afib? Since last night.    Are you having the symptoms now? yes  Are you currently experiencing lightheadedness, SOB or CP? no  Do you have a history of afib (atrial fibrillation) or irregular heart rhythm? Yes, A-Fib  Have you checked your BP or HR? (document readings if available): HR below 100, hasn't check BP  Are you experiencing any other symptoms? No, just feels tired. Patient states he did take 1/2 tablet of metoprolol last night. He wants to know if he should continue to taking metoprolol.

## 2021-10-08 NOTE — Telephone Encounter (Signed)
Spoke with patient who stated he went into afib last night. He believes having "diarrhea X3 in one hour after eating [employer] caused the afib. He has been rehydrating today. He stated his pulse was over 100 last night, so he took metoprolol tartrate that he has had from last year to bring heart rate down. He said a cardiologist in [place] prescribed it last year. I advised him there is not an order for as needed metoprolol. While on the phone, BP 131/93, P 58. He has an appointment on 2/27 and wanted an earlier one. Unable to schedule an earlier appointment. Recommended that if patient gets dizzy, has chest pain, or heart rate remains above 100, to go to the ED. Pt voiced understanding of this conversation. Please advise.

## 2021-10-09 ENCOUNTER — Other Ambulatory Visit: Payer: Self-pay

## 2021-10-09 MED ORDER — METOPROLOL SUCCINATE ER 25 MG PO TB24: ORAL_TABLET | ORAL | 2 refills | Status: DC

## 2021-10-09 MED ORDER — METOPROLOL SUCCINATE ER 25 MG PO TB24: 12.5000 mg | ORAL_TABLET | Freq: Every day | ORAL | 2 refills | Status: DC

## 2021-10-09 NOTE — Telephone Encounter (Signed)
Spoke with patient who reports this AM pulse was 72. He will try metoprolol succinate 12.5 mg and monitor BP and P along with symptoms and let us know his results. Prescription sent to pt. Pharmacy. He would like to have an appointment with the afib clinic.

## 2021-10-09 NOTE — Telephone Encounter (Signed)
LMTCB

## 2021-10-09 NOTE — Telephone Encounter (Signed)
Patient called triage to report he converted out of afbi and asked if he should keep his afib clinic appointment. I reccommended that he keep it. He was concerned it would nullify his appointment with cardiologist. I reassured him that would not happen and explained what the afib clinic's purpose was. He stated he will keep his appointment.

## 2021-10-09 NOTE — Telephone Encounter (Signed)
Called and spoke with patient.  He is agreeable to appt 10/10/21 at 2:30 pm with Afib Clinic.  Directions and phone number for clinic provided to patient.

## 2021-10-09 NOTE — Telephone Encounter (Signed)
Patient returned call

## 2021-10-10 ENCOUNTER — Other Ambulatory Visit: Payer: Self-pay

## 2021-10-10 ENCOUNTER — Encounter (HOSPITAL_COMMUNITY): Payer: Self-pay | Admitting: Physician Assistant

## 2021-10-10 ENCOUNTER — Ambulatory Visit (HOSPITAL_COMMUNITY)
Admission: RE | Admit: 2021-10-10 | Discharge: 2021-10-10 | Disposition: A | Discharge status: Home / Self Care | Payer: 59 | Source: Ambulatory Visit | Attending: Physician Assistant | Admitting: Physician Assistant

## 2021-10-10 VITALS — BP 124/86 | HR 55 | Ht 70.5 in | Wt 192.0 lb

## 2021-10-10 DIAGNOSIS — E039 Hypothyroidism, unspecified: Secondary | ICD-10-CM | POA: Diagnosis not present

## 2021-10-10 DIAGNOSIS — E785 Hyperlipidemia, unspecified: Secondary | ICD-10-CM | POA: Diagnosis not present

## 2021-10-10 DIAGNOSIS — Z79899 Other long term (current) drug therapy: Secondary | ICD-10-CM | POA: Diagnosis not present

## 2021-10-10 DIAGNOSIS — Z9989 Dependence on other enabling machines and devices: Secondary | ICD-10-CM | POA: Insufficient documentation

## 2021-10-10 DIAGNOSIS — Z7901 Long term (current) use of anticoagulants: Secondary | ICD-10-CM | POA: Diagnosis not present

## 2021-10-10 DIAGNOSIS — I480 Paroxysmal atrial fibrillation: Secondary | ICD-10-CM | POA: Insufficient documentation

## 2021-10-10 DIAGNOSIS — G4733 Obstructive sleep apnea (adult) (pediatric): Secondary | ICD-10-CM | POA: Insufficient documentation

## 2021-10-10 NOTE — Progress Notes (Signed)
Primary Care Physician: Bayard Beaver., MD Primary Cardiologist: Dr Stanford Breed Primary Electrophysiologist: Dr Quentin Ore Referring Physician: Oda Kilts PA   Dylan Hale is a 71 y.o. male with a history of HLD, hypothyroidism, OSA, atrial fibrillation who presents for follow up in the [facility]. The patient presented to the emergency department on February 24, 2021 in [place] after a syncopal episode.  He was found to be in atrial fibrillation with RVR.  He was ultimately cardioverted using IV Cardizem. Patient is on Eliquis for a CHADS2VASC score of 1. [facility] 10[facility]08[facility]21 with palpitations and fatigue. He was started on Toprol and he converted back to SR. Patient does report that he does not drink alcohol often but he did have drinks 3 days in a row prior to the onset of his afib.   Today, he denies symptoms of palpitations, chest pain, shortness of breath, orthopnea, PND, lower extremity edema, dizziness, presyncope, syncope, bleeding, or neurologic sequela. The patient is tolerating medications without difficulties and is otherwise without complaint today.    Atrial Fibrillation Risk Factors:  he does have symptoms or diagnosis of sleep apnea. he is compliant with CPAP therapy. he does not have a history of rheumatic fever. he does have a history of alcohol use.   he has a BMI of Body mass index is 27.16 kg/m.Marland Kitchen Filed Weights   10/10/21 1431  Weight: 87.1 kg    Family History  Problem Relation Age of Onset   Prostate cancer Father    Anxiety disorder Mother    Autoimmune disease Mother    Colon cancer Maternal Grandmother    Emphysema Maternal Grandfather    Lung cancer Paternal Grandfather      Atrial Fibrillation Management history:  Previous antiarrhythmic drugs: none Previous cardioversions: none Previous ablations: none CHADS2VASC score: 1 Anticoagulation history: Eliquis   Past Medical History:  Diagnosis Date    Anxiety    Chest pain    Depression    Enlarged prostate    Hyperlipidemia    Hypogonadism male    Hypothyroidism    Plantar fibromatosis    right foot   Past Surgical History:  Procedure Laterality Date   MASS EXCISION Right 08/02/2015   Procedure: EXCISION OF RIGHT FOOT MASS;  Surgeon: Wylene Simmer, MD;  Location: [facility];  Service: Orthopedics;  Laterality: Right;   SHOULDER SURGERY Left    Arthroscopic    Current Outpatient Medications  Medication Sig Dispense Refill   apixaban (ELIQUIS) 5 MG TABS tablet Take 1 tablet (5 mg total) by mouth 2 (two) times daily. 60 tablet 5   aspirin EC 81 MG tablet Take 81 mg by mouth daily.     Cholecalciferol (VITAMIN D-3) 1000 UNITS CAPS Take 2,000 Units by mouth daily.     clonazePAM (KLONOPIN) 0.5 MG tablet Take 0.25 mg by mouth 2 (two) times daily.     levothyroxine (SYNTHROID, LEVOTHROID) 75 MCG tablet Take 75 mcg by mouth daily.     metoprolol succinate (TOPROL XL) 25 MG 24 hr tablet Take 12.5 mg (one-half tablet) each night. 30 tablet 2   Omega-3 Fatty Acids (FISH OIL) 1000 MG CAPS Take by mouth.     PARoxetine (PAXIL) 30 MG tablet Take 30 mg by mouth daily.     tamsulosin (FLOMAX) 0.4 MG CAPS capsule Take 0.4 mg by mouth daily.     Testosterone 20.25 MG/ACT (1.62%) GEL Apply 2 Pump topically every morning. To each underarm after  shower     No current facility-administered medications for this encounter.    Allergies  Allergen Reactions   Wound Dressing Adhesive     Other reaction(s): Other Redness, skin irritation   Sudafed [Pseudoephedrine] Other (See Comments)    jitters    Social History   Socioeconomic History   Marital status: Married    Spouse name: Not on file   Number of children: 4   Years of education: Not on file   Highest education level: Not on file  Occupational History   Not on file  Tobacco Use   Smoking status: Never   Smokeless tobacco: Never  Vaping Use   Vaping Use: Never used   Substance and Sexual Activity   Alcohol use: Yes    Alcohol/week: 2.0 - 3.0 standard drinks    Types: 2 - 3 Cans of beer per week    Comment: socail settings 10/10/21   Drug use: No   Sexual activity: Yes  Other Topics Concern   Not on file  Social History Narrative   Not on file   Social Determinants of Health   Financial Resource Strain: Not on file  Food Insecurity: Not on file  Transportation Needs: Not on file  Physical Activity: Not on file  Stress: Not on file  Social Connections: Not on file  Intimate Partner Violence: Not on file     ROS- All systems are reviewed and negative except as per the HPI above.  Physical Exam: Vitals:   10/10/21 1431  BP: 124/86  Pulse: (!) 55  Weight: 87.1 kg  Height: 5' 10.5" (1.791 m)    GEN- The patient is a well appearing male, alert and oriented x 3 today.   Head- normocephalic, atraumatic Eyes-  Sclera clear, conjunctiva pink Ears- hearing intact Oropharynx- clear Neck- supple  Lungs- Clear to ausculation bilaterally, normal work of breathing Heart- Regular rate and rhythm, no murmurs, rubs or gallops  GI- soft, NT, ND, + BS Extremities- no clubbing, cyanosis, or edema MS- no significant deformity or atrophy Skin- no rash or lesion Psych- euthymic mood, full affect Neuro- strength and sensation are intact  Wt Readings from Last 3 Encounters:  10/10/21 87.1 kg  04/23/21 83 kg  03/27/21 81.6 kg    EKG today demonstrates  SB Vent. rate 55 BPM PR interval 166 ms QRS duration 88 ms QT/QTcB 394/376 ms  Echo 02/24/21 demonstrated   1. Left ventricular ejection fraction, by estimation, is 65 to 70%. The  left ventricle has normal function. The left ventricle has no regional  wall motion abnormalities. There is moderate left ventricular hypertrophy. Left ventricular diastolic parameters are consistent with Grade I diastolic dysfunction (impaired relaxation).   2. Right ventricular systolic function is normal. The  right ventricular  size is normal. Tricuspid regurgitation signal is inadequate for assessing PA pressure.   3. The mitral valve is abnormal. trivial to mild mitral valve  regurgitation.   4. The aortic valve is tricuspid. Aortic valve regurgitation is mild.  Mild aortic valve sclerosis is present, with no evidence of aortic valve  stenosis.   5. The inferior vena cava is normal in size with greater than 50%  respiratory variability, suggesting right atrial pressure of 3 mmHg.   Epic records are reviewed at length today  CHA2DS2-VASc Score = 1  The patient's score is based upon: CHF History: 0 HTN History: 0 Diabetes History: 0 Stroke History: 0 Vascular Disease History: 0 Age Score: 1 Gender Score: 0  ASSESSMENT AND PLAN: 1. Paroxysmal Atrial Fibrillation (ICD10:  I48.0) The patient's CHA2DS2-VASc score is 1, indicating a 0.6% annual risk of stroke.   Continue Eliquis 5 mg BID Continue Toprol 12.5 mg daily We discussed rhythm control options today including Multaq, flecainide, and ablation. He would like to continue present therapy for now.    2. Obstructive sleep apnea The importance of adequate treatment of sleep apnea was discussed today in order to improve our ability to maintain sinus rhythm long term. Patient reports compliance with CPAP therapy.    Follow up with Oda Kilts as scheduled. Offered to reschedule but patient would like to keep appointment.    [facility] [address] [place], [place] [address] [phone] 10/10/2021 2:42 PM

## 2021-10-17 NOTE — Progress Notes (Signed)
PCP:  Bayard Beaver., MD Primary Cardiologist: Kirk Ruths, MD Electrophysiologist: Vickie Epley, MD   Dylan Hale is a 71 y.o. male seen today for Vickie Epley, MD for routine electrophysiology followup.  Since last being seen in our clinic the patient reports doing well overall. He had an episode of AF that lasted approximately 40 hrs after a long weekend of drinking.  he denies chest pain, palpitations, dyspnea, PND, orthopnea, nausea, vomiting, dizziness, syncope, edema, weight gain, or early satiety.  Past Medical History:  Diagnosis Date   Anxiety    Chest pain    Depression    Enlarged prostate    Hyperlipidemia    Hypogonadism male    Hypothyroidism    Plantar fibromatosis    right foot   Past Surgical History:  Procedure Laterality Date   MASS EXCISION Right 08/02/2015   Procedure: EXCISION OF RIGHT FOOT MASS;  Surgeon: Wylene Simmer, MD;  Location: [facility];  Service: Orthopedics;  Laterality: Right;   SHOULDER SURGERY Left    Arthroscopic    Current Outpatient Medications  Medication Sig Dispense Refill   apixaban (ELIQUIS) 5 MG TABS tablet Take 1 tablet (5 mg total) by mouth 2 (two) times daily. 60 tablet 5   aspirin EC 81 MG tablet Take 81 mg by mouth daily.     Cholecalciferol (VITAMIN D-3) 1000 UNITS CAPS Take 2,000 Units by mouth daily.     clonazePAM (KLONOPIN) 0.5 MG tablet Take 0.25 mg by mouth 2 (two) times daily.     levothyroxine (SYNTHROID, LEVOTHROID) 75 MCG tablet Take 75 mcg by mouth daily.     metoprolol succinate (TOPROL XL) 25 MG 24 hr tablet Take 12.5 mg (one-half tablet) each night. 30 tablet 2   Omega-3 Fatty Acids (FISH OIL) 1000 MG CAPS Take 1 capsule by mouth daily.     PARoxetine (PAXIL) 20 MG tablet Take 20 mg by mouth daily.     tamsulosin (FLOMAX) 0.4 MG CAPS capsule Take 0.4 mg by mouth daily.     Testosterone 20.25 MG/ACT (1.62%) GEL Apply 2 Pump topically every morning. To each underarm after shower      No current facility-administered medications for this visit.    Allergies  Allergen Reactions   Wound Dressing Adhesive     Other reaction(s): Other Redness, skin irritation   Sudafed [Pseudoephedrine] Other (See Comments)    jitters    Social History   Socioeconomic History   Marital status: Married    Spouse name: Not on file   Number of children: 4   Years of education: Not on file   Highest education level: Not on file  Occupational History   Not on file  Tobacco Use   Smoking status: Never    Passive exposure: Past   Smokeless tobacco: Never  Vaping Use   Vaping Use: Never used  Substance and Sexual Activity   Alcohol use: Yes    Alcohol/week: 2.0 - 3.0 standard drinks    Types: 2 - 3 Cans of beer per week    Comment: socail settings 10/10/21   Drug use: No   Sexual activity: Yes  Other Topics Concern   Not on file  Social History Narrative   Not on file   Social Determinants of Health   Financial Resource Strain: Not on file  Food Insecurity: Not on file  Transportation Needs: Not on file  Physical Activity: Not on file  Stress: Not on file  Social  Connections: Not on file  Intimate Partner Violence: Not on file     Review of Systems: All other systems reviewed and are otherwise negative except as noted above.  Physical Exam: Vitals:   10/21/21 0953  BP: 108/74  Pulse: 83  SpO2: 96%  Weight: 191 lb (86.6 kg)  Height: 5' 10.5" (1.791 m)    GEN- The patient is well appearing, alert and oriented x 3 today.   HEENT: normocephalic, atraumatic; sclera clear, conjunctiva pink; hearing intact; oropharynx clear; neck supple, no JVP Lymph- no cervical lymphadenopathy Lungs- Clear to ausculation bilaterally, normal work of breathing.  No wheezes, rales, rhonchi Heart- Regular rate and rhythm, no murmurs, rubs or gallops, PMI not laterally displaced GI- soft, non-tender, non-distended, bowel sounds present, no hepatosplenomegaly Extremities- no  clubbing, cyanosis, or edema; DP/PT/radial pulses 2+ bilaterally MS- no significant deformity or atrophy Skin- warm and dry, no rash or lesion Psych- euthymic mood, full affect Neuro- strength and sensation are intact  EKG is not ordered. Personal review of EKG from  10/10/2021  shows sinus brady at 55  Additional studies reviewed include: Previous EP office notes.   Assessment and Plan:  1. Paroxysmal Atrial Fibrillation (ICD10:  I48.0) The patient's CHA2DS2-VASc score is 1, indicating a 0.6% annual risk of stroke.   Continue Eliquis 5 mg BID Continue Toprol 12.5 mg daily We again discussed rhythm control options today including Multaq, flecainide, and ablation. He would like to continue present therapy for now.   Avoid triggers; specifically ETOH.    2. Obstructive sleep apnea Encouraged nightly CPAP.   Follow up with Dr. Quentin Ore in 6 months   Shirley Friar, PA-C  10/21/21 10:55 AM

## 2021-10-21 ENCOUNTER — Ambulatory Visit: Payer: 59 | Admitting: Student

## 2021-10-21 ENCOUNTER — Encounter: Payer: Self-pay | Admitting: Student

## 2021-10-21 ENCOUNTER — Other Ambulatory Visit: Payer: Self-pay

## 2021-10-21 VITALS — BP 108/74 | HR 83 | Ht 70.5 in | Wt 191.0 lb

## 2021-10-21 DIAGNOSIS — I480 Paroxysmal atrial fibrillation: Secondary | ICD-10-CM | POA: Diagnosis not present

## 2021-10-21 DIAGNOSIS — G4733 Obstructive sleep apnea (adult) (pediatric): Secondary | ICD-10-CM

## 2021-10-21 LAB — BASIC METABOLIC PANEL
BUN/Creatinine Ratio: 16 (ref 10–24)
BUN: 20 mg/dL (ref 8–27)
CO2: 26 mmol/L (ref 20–29)
Calcium: 9 mg/dL (ref 8.6–10.2)
Chloride: 105 mmol/L (ref 96–106)
Creatinine, Ser: 1.22 mg/dL (ref 0.76–1.27)
Glucose: 89 mg/dL (ref 70–99)
Potassium: 4.5 mmol/L (ref 3.5–5.2)
Sodium: 141 mmol/L (ref 134–144)
eGFR: 64 mL/min/{1.73_m2} (ref >59)

## 2021-10-21 LAB — CBC
Hematocrit: 44.8 % (ref 37.5–51.0)
Hemoglobin: 16.1 g/dL (ref 13.0–17.7)
MCH: 32.7 pg (ref 26.6–33.0)
MCHC: 35.9 g/dL — ABNORMAL HIGH (ref 31.5–35.7)
MCV: 91 fL (ref 79–97)
Platelets: 203 10*3/uL (ref 150–450)
RBC: 4.92 x10E6/uL (ref 4.14–5.80)
RDW: 12.3 % (ref 11.6–15.4)
WBC: 5.2 10*3/uL (ref 3.4–10.8)

## 2021-10-21 NOTE — Patient Instructions (Signed)
Medication Instructions:  Your physician recommends that you continue on your current medications as directed. Please refer to the Current Medication list given to you today.  *If you need a refill on your cardiac medications before your next appointment, please call your pharmacy*   Lab Work: TODAY: BMET, CBC  If you have labs (blood work) drawn today and your tests are completely normal, you will receive your results only by: MyChart Message (if you have MyChart) OR A paper copy in the mail If you have any lab test that is abnormal or we need to change your treatment, we will call you to review the results.   Follow-Up: At [facility], you and your health needs are our priority.  As part of our continuing mission to provide you with exceptional heart care, we have created designated Provider Care Teams.  These Care Teams include your primary Cardiologist (physician) and Advanced Practice Providers (APPs -  Physician Assistants and Nurse Practitioners) who all work together to provide you with the care you need, when you need it.   Your next appointment:   6 month(s)  The format for your next appointment:   In Person  Provider:   Cameron Lambert, MD{   

## 2021-11-02 ENCOUNTER — Encounter: Payer: Self-pay | Admitting: Cardiology

## 2021-11-05 ENCOUNTER — Telehealth: Payer: Self-pay | Admitting: Cardiology

## 2021-11-05 NOTE — Telephone Encounter (Signed)
Called patient about his message. Reviewed patient's last mychart messages. Patient has had several A. FIB episode in the last month (4). Patient was on metoprolol tartrate and then was off it due to bradycardia. He was started back on Metoprolol succinate due to A. FIB episodes and HR is still low, but patient had an A. FIB episode that started on Sunday and lasted for 24 hours. Patient stated he is doing better on metoprolol succinate, but he needs something to take that will deal with these A. FIB episodes he keeps having. Patient stated after he has these episodes he feels really weak on the left arm. Patient does not want to see PA or A. FIB clinic he wants to see Dr. Quentin Ore. Patient stated he might have to have the ablation, but he would like to have an alternative if possible. Will forward to Dr. Quentin Ore. ?

## 2021-11-05 NOTE — Telephone Encounter (Signed)
Patient c/o Palpitations:  High priority if patient c/o lightheadedness, shortness of breath, or chest pain ? ?How long have you had palpitations/irregular HR/ Afib?about a day ? ? Are you having the symptoms now? yes ? ?Are you currently experiencing lightheadedness, SOB or CP? Patient was lightheaded yesterday. Symptoms have resolved today ? ?Do you have a history of afib (atrial fibrillation) or irregular heart rhythm? yes ? ?Have you checked your BP or HR? (document readings if available):  ? ?Are you experiencing any other symptoms? No ? ?Patient wanted to talk to the RN about what medications to take . He said he sent mychart messages on Friday but did not get a  ?

## 2021-11-06 ENCOUNTER — Encounter: Payer: Self-pay | Admitting: Cardiology

## 2021-11-07 NOTE — Telephone Encounter (Signed)
?  Pt is scheduled for 11/27/21 at 1:40 in [place]. Pt was willing to travel there to if he got a sooner appt.  ? ?Vickie Epley, MD ? ?Please schedule with me at next open appt slot.  ?Thanks,  ?Lysbeth Galas  ?

## 2021-11-27 ENCOUNTER — Telehealth: Payer: Self-pay | Admitting: Cardiology

## 2021-11-27 ENCOUNTER — Encounter: Payer: Self-pay | Admitting: *Deleted

## 2021-11-27 ENCOUNTER — Ambulatory Visit: Payer: 59 | Admitting: Cardiology

## 2021-11-27 ENCOUNTER — Encounter: Payer: Self-pay | Admitting: Cardiology

## 2021-11-27 VITALS — BP 122/70 | HR 43 | Ht 70.5 in | Wt 191.0 lb

## 2021-11-27 DIAGNOSIS — Z01818 Encounter for other preprocedural examination: Secondary | ICD-10-CM | POA: Diagnosis not present

## 2021-11-27 DIAGNOSIS — I480 Paroxysmal atrial fibrillation: Secondary | ICD-10-CM

## 2021-11-27 NOTE — Patient Instructions (Addendum)
Medications: ?Your physician recommends that you continue on your current medications as directed. Please refer to the Current Medication list given to you today. ?*If you need a refill on your cardiac medications before your next appointment, please call your pharmacy* ? ?Lab Work: ?None. ?If you have labs (blood work) drawn today and your tests are completely normal, you will receive your results only by: ?MyChart Message (if you have MyChart) OR ?A paper copy in the mail ?If you have any lab test that is abnormal or we need to change your treatment, we will call you to review the results. ? ?Testing/Procedures: ?Your physician has requested that you have cardiac CT. Cardiac computed tomography (CT) is a painless test that uses an x-ray machine to take clear, detailed pictures of your heart. For further information please visit [email or web address]. Please follow instruction sheet as given. ?  ?Your physician has recommended that you have an ablation. Catheter ablation is a medical procedure used to treat some cardiac arrhythmias (irregular heartbeats). During catheter ablation, a long, thin, flexible tube is put into a blood vessel in your groin (upper thigh), or neck. This tube is called an ablation catheter. It is then guided to your heart through the blood vessel. Radio frequency waves destroy small areas of heart tissue where abnormal heartbeats may cause an arrhythmia to start. Please see the instruction sheet given to you today.  ? ?Follow-Up: ?At [facility] you and your health needs are our priority.  As part of our continuing mission to provide you with exceptional heart care, we have created designated Provider Care Teams.  These Care Teams include your primary Cardiologist (physician) and Advanced Practice Providers (APPs -  Physician Assistants and Nurse Practitioners) who all work together to provide you with the care you need, when you need it. ? ?Your physician wants you to follow-up in: see  instruction letter.  ? ?We recommend signing up for the patient portal called "MyChart".  Sign up information is provided on this After Visit Summary.  MyChart is used to connect with patients for Virtual Visits (Telemedicine).  Patients are able to view lab/test results, encounter notes, upcoming appointments, etc.  Non-urgent messages can be sent to your provider as well.   ?To learn more about what you can do with MyChart, go to [email or web address].   ? ?Any Other Special Instructions Will Be Listed Below (If Applicable).  ? ?Cardiac Ablation ?Cardiac ablation is a procedure to destroy (ablate) some heart tissue that is sending bad signals. These bad signals cause problems in heart rhythm. ?The heart has many areas that make these signals. If there are problems in these areas, they can make the heart beat in a way that is not normal. Destroying some tissues can help make the heart rhythm normal. ?Tell your doctor about: ?Any allergies you have. ?All medicines you are taking. These include vitamins, herbs, eye drops, creams, and over-the-counter medicines. ?Any problems you or family members have had with medicines that make you fall asleep (anesthetics). ?Any blood disorders you have. ?Any surgeries you have had. ?Any medical conditions you have, such as kidney failure. ?Whether you are pregnant or may be pregnant. ?What are the risks? ?This is a safe procedure. But problems may occur, including: ?Infection. ?Bruising and bleeding. ?Bleeding into the chest. ?Stroke or blood clots. ?Damage to nearby areas of your body. ?Allergies to medicines or dyes. ?The need for a pacemaker if the normal system is damaged. ?Failure of the procedure to treat the problem. ?What  happens before the procedure? ?Medicines ?Ask your doctor about: ?Changing or stopping your normal medicines. This is important. ?Taking aspirin and ibuprofen. Do not take these medicines unless your doctor tells you to take them. ?Taking other  medicines, vitamins, herbs, and supplements. ?General instructions ?Follow instructions from your doctor about what you cannot eat or drink. ?Plan to have someone take you home from the hospital or clinic. ?If you will be going home right after the procedure, plan to have someone with you for 24 hours. ?Ask your doctor what steps will be taken to prevent infection. ?What happens during the procedure? ? ?An IV tube will be put into one of your veins. ?You will be given a medicine to help you relax. ?The skin on your neck or groin will be numbed. ?A cut (incision) will be made in your neck or groin. A needle will be put through your cut and into a large vein. ?A tube (catheter) will be put into the needle. The tube will be moved to your heart. ?Dye may be put through the tube. This helps your doctor see your heart. ?Small devices (electrodes) on the tube will send out signals. ?A type of energy will be used to destroy some heart tissue. ?The tube will be taken out. ?Pressure will be held on your cut. This helps stop bleeding. ?A bandage will be put over your cut. ?The exact procedure may vary among doctors and hospitals. ?What happens after the procedure? ?You will be watched until you leave the hospital or clinic. This includes checking your heart rate, breathing rate, oxygen, and blood pressure. ?Your cut will be watched for bleeding. You will need to lie still for a few hours. ?Do not drive for 24 hours or as long as your doctor tells you. ?Summary ?Cardiac ablation is a procedure to destroy some heart tissue. This is done to treat heart rhythm problems. ?Tell your doctor about any medical conditions you may have. Tell him or her about all medicines you are taking to treat them. ?This is a safe procedure. But problems may occur. These include infection, bruising, bleeding, and damage to nearby areas of your body. ?Follow what your doctor tells you about food and drink. You may also be told to change or stop some of  your medicines. ?After the procedure, do not drive for 24 hours or as long as your doctor tells you. ?This information is not intended to replace advice given to you by your health care provider. Make sure you discuss any questions you have with your health care provider. ?Document Revised: 07/14/2019 Document Reviewed: 07/14/2019 ?Elsevier Patient Education ? [employer]. ? ?

## 2021-11-27 NOTE — Progress Notes (Signed)
?Electrophysiology Office Follow up Visit Note:   ? ?Date:  11/27/2021  ? ?ID:  Dylan Hale, DOB 1951/02/09, MRN [id number] ? ?PCP:  Bayard Beaver., MD  ?[facility] Cardiologist:  Kirk Ruths, MD  ?[facility] Electrophysiologist:  Vickie Epley, MD  ? ? ?Interval History:   ? ?Dylan Hale is a 71 y.o. male who presents for a follow up visit. They were last seen in clinic April 23, 2021 for paroxysmal atrial fibrillation.  At that visit he had a low burden but highly symptomatic paroxysms of atrial fibrillation.  He is on Eliquis for stroke prophylaxis.  At that appointment he wished to pursue a conservative treatment strategy and avoid invasive procedures so we plan to see him back in 6 months.  He is on metoprolol but this is because sinus bradycardia with rates in the low 40s. ? ?He tells me that his atrial fibrillation episodes have come in clusters.  The most recent cluster of atrial fibrillation was 3 weeks ago.  The longest episode was 41 hours.  He feels "off" after an episode of atrial fibrillation has resolved.  He continues to take Eliquis for stroke prophylaxis. ? ? ?  ? ?Past Medical History:  ?Diagnosis Date  ? Anxiety   ? Chest pain   ? Depression   ? Enlarged prostate   ? Hyperlipidemia   ? Hypogonadism male   ? Hypothyroidism   ? 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 ? ?Past Surgical History:  ?Procedure Laterality Date  ? MASS EXCISION Right 08/02/2015  ? Procedure: EXCISION OF RIGHT FOOT MASS;  Surgeon: Wylene Simmer, MD;  Location: [place];  Service: Orthopedics;  Laterality: Right;  ? SHOULDER SURGERY Left   ? Arthroscopic  ? ? ?Current Medications: ?Current Meds  ?Medication Sig  ? apixaban (ELIQUIS) 5 MG TABS tablet Take 1 tablet (5 mg total) by mouth 2 (two) times daily.  ? aspirin EC 81 MG tablet Take 81 mg by mouth daily.  ? cetirizine (ZYRTEC) 10 MG tablet Take 10 mg by mouth daily.  ? Cholecalciferol (VITAMIN D-3) 1000 UNITS CAPS Take 2,000 Units  by mouth daily.  ? clonazePAM (KLONOPIN) 0.5 MG tablet Take 0.25 mg by mouth 2 (two) times daily.  ? levothyroxine (SYNTHROID, LEVOTHROID) 75 MCG tablet Take 75 mcg by mouth daily.  ? metoprolol succinate (TOPROL XL) 25 MG 24 hr tablet Take 12.5 mg (one-half tablet) each night.  ? Omega-3 Fatty Acids (FISH OIL) 1000 MG CAPS Take 1 capsule by mouth daily.  ? PARoxetine (PAXIL) 20 MG tablet Take 20 mg by mouth daily.  ? rosuvastatin (CRESTOR) 10 MG tablet Take 10 mg by mouth daily.  ? tamsulosin (FLOMAX) 0.4 MG CAPS capsule Take 0.4 mg by mouth daily.  ? Testosterone 20.25 MG/ACT (1.62%) GEL Apply 2 Pump topically every morning. To each underarm after shower  ?  ? ?Allergies:   Wound dressing adhesive and Sudafed [pseudoephedrine]  ? ?Social History  ? ?Socioeconomic History  ? Marital status: Married  ?  Spouse name: Not on file  ? Number of children: 4  ? Years of education: Not on file  ? Highest education level: Not on file  ?Occupational History  ? Not on file  ?Tobacco Use  ? Smoking status: Never  ?  Passive exposure: Past  ? Smokeless tobacco: Never  ?Vaping Use  ? Vaping Use: Never used  ?Substance and Sexual Activity  ? Alcohol use: Yes  ?  Alcohol/week: 2.0 - 3.0 standard drinks  ?  Types: 2 - 3 Cans of beer per week  ?  Comment: socail settings 10/10/21  ? Drug use: No  ? Sexual activity: Yes  ?Other Topics Concern  ? Not on file  ?Social History Narrative  ? Not on file  ? ?Social Determinants of Health  ? ?Financial Resource Strain: Not on file  ?Food Insecurity: Not on file  ?Transportation Needs: Not on file  ?Physical Activity: Not on file  ?Stress: Not on file  ?Social Connections: Not on file  ?  ? ?Family History: ?The patient's family history includes Anxiety disorder in his mother; Autoimmune disease in his mother; Colon cancer in his maternal grandmother; Emphysema in his maternal grandfather; Lung cancer in his paternal grandfather; Prostate cancer in his father. ? ?ROS:   ?Please see the  history of present illness.    ?All other systems reviewed and are negative. ? ?EKGs/Labs/Other Studies Reviewed:   ? ?The following studies were reviewed today: ? ?EKG:  The ekg ordered today demonstrates sinus bradycardia with a ventricular rate of 43 bpm. ? ?Recent Labs: ?02/24/2021: TSH 2.381 ?02/25/2021: ALT 22; Magnesium 2.0 ?10/21/2021: BUN 20; Creatinine, Ser 1.22; Hemoglobin 16.1; Platelets 203; Potassium 4.5; Sodium 141  ?Recent Lipid Panel ?No results found for: CHOL, TRIG, HDL, CHOLHDL, VLDL, LDLCALC, LDLDIRECT ? ?Physical Exam:   ? ?VS:  BP 122/70 (BP Location: Left Arm, Patient Position: Sitting, Cuff Size: Normal)   Pulse (!) 43   Ht 5' 10.5" (1.791 m)   Wt 191 lb (86.6 kg)   SpO2 96%   BMI 27.02 kg/m?    ? ?Wt Readings from Last 3 Encounters:  ?11/27/21 191 lb (86.6 kg)  ?10/21/21 191 lb (86.6 kg)  ?10/10/21 192 lb (87.1 kg)  ?  ? ?GEN:  Well nourished, well developed in no acute distress ?HEENT: Normal ?NECK: No JVD; No carotid bruits ?LYMPHATICS: No lymphadenopathy ?CARDIAC: RRR, no murmurs, rubs, gallops ?RESPIRATORY:  Clear to auscultation without rales, wheezing or rhonchi  ?ABDOMEN: Soft, non-tender, non-distended ?MUSCULOSKELETAL:  No edema; No deformity  ?SKIN: Warm and dry ?NEUROLOGIC:  Alert and oriented x 3 ?PSYCHIATRIC:  Normal affect  ? ? ? ?  ? ?ASSESSMENT:   ? ?1. Paroxysmal atrial fibrillation (HCC)   ? ?PLAN:   ? ?In order of problems listed above: ? ?#Paroxysmal atrial fibrillation ?Symptomatic episodes.  Fairly rare frequency but they are symptomatic and his heart rates limit our ability to use antiarrhythmic drugs.  I discussed antiarrhythmic drugs again during today's appointment including flecainide, Rythmol, Multaq, amiodarone and Tikosyn.  I think we [place] have difficulty getting any of his medications on board given his resting sinus bradycardia in the low 40s.  I discussed catheter ablation again with the patient during today's visit include the risk, recovery and likelihood  of success being about 80 to 85% for his type of atrial fibrillation.  He is interested in proceeding with scheduling.  He will continue the Eliquis. ? ?Risk, benefits, and alternatives to EP study and radiofrequency ablation for afib were also discussed in detail today. These risks include but are not limited to stroke, bleeding, vascular damage, tamponade, perforation, damage to the esophagus, lungs, and other structures, pulmonary vein stenosis, worsening renal function, and death. The patient understands these risk and wishes to proceed.  We will therefore proceed with catheter ablation at the next available time.  Carto, ICE, anesthesia are requested for the procedure.  Will also obtain CT PV  protocol prior to the procedure to exclude LAA thrombus and further evaluate atrial anatomy. ? ? ? ? ?Total time spent with patient today 45 minutes. This includes reviewing records, evaluating the patient and coordinating care.  ? ?Medication Adjustments/Labs and Tests Ordered: ?Current medicines are reviewed at length with the patient today.  Concerns regarding medicines are outlined above.  ?No orders of the defined types were placed in this encounter. ? ?No orders of the defined types were placed in this encounter. ? ? ? ?Signed, ?Lars Mage, MD, [facility], [facility] ?11/27/2021 2:27 PM    ?Electrophysiology ?[place] ?

## 2021-11-28 NOTE — Telephone Encounter (Signed)
Attempted to contact to schedule post ablation appts.   ? ?Scheduled with Afib Clinic 8/17 at 25 . 6 m fu pending schedule not templated at this time.  Recall placed.  ?

## 2022-01-14 ENCOUNTER — Encounter: Payer: Self-pay | Admitting: Cardiology

## 2022-01-31 NOTE — Telephone Encounter (Signed)
Patient keeping July visit for now and will fu as told at that time .  Closing encounter.

## 2022-02-20 ENCOUNTER — Other Ambulatory Visit: Payer: 59

## 2022-02-20 DIAGNOSIS — I480 Paroxysmal atrial fibrillation: Secondary | ICD-10-CM

## 2022-02-20 DIAGNOSIS — Z01818 Encounter for other preprocedural examination: Secondary | ICD-10-CM

## 2022-02-20 LAB — BASIC METABOLIC PANEL
BUN/Creatinine Ratio: 13 (ref 10–24)
BUN: 15 mg/dL (ref 8–27)
CO2: 28 mmol/L (ref 20–29)
Calcium: 9.5 mg/dL (ref 8.6–10.2)
Chloride: 102 mmol/L (ref 96–106)
Creatinine, Ser: 1.13 mg/dL (ref 0.76–1.27)
Glucose: 88 mg/dL (ref 70–99)
Potassium: 5 mmol/L (ref 3.5–5.2)
Sodium: 140 mmol/L (ref 134–144)
eGFR: 70 mL/min/{1.73_m2} (ref >59)

## 2022-02-20 LAB — CBC WITH DIFFERENTIAL/PLATELET
Basophils Absolute: 0.1 10*3/uL (ref 0.0–0.2)
Basos: 2 %
EOS (ABSOLUTE): 0.4 10*3/uL (ref 0.0–0.4)
Eos: 7 %
Hematocrit: 45.9 % (ref 37.5–51.0)
Hemoglobin: 16.1 g/dL (ref 13.0–17.7)
Immature Grans (Abs): 0 10*3/uL (ref 0.0–0.1)
Immature Granulocytes: 1 %
Lymphocytes Absolute: 1.9 10*3/uL (ref 0.7–3.1)
Lymphs: 35 %
MCH: 32.7 pg (ref 26.6–33.0)
MCHC: 35.1 g/dL (ref 31.5–35.7)
MCV: 93 fL (ref 79–97)
Monocytes Absolute: 0.6 10*3/uL (ref 0.1–0.9)
Monocytes: 11 %
Neutrophils Absolute: 2.3 10*3/uL (ref 1.4–7.0)
Neutrophils: 44 %
Platelets: 191 10*3/uL (ref 150–450)
RBC: 4.93 x10E6/uL (ref 4.14–5.80)
RDW: 12.9 % (ref 11.6–15.4)
WBC: 5.3 10*3/uL (ref 3.4–10.8)

## 2022-03-03 ENCOUNTER — Encounter: Payer: Self-pay | Admitting: Cardiology

## 2022-03-07 ENCOUNTER — Telehealth (HOSPITAL_COMMUNITY): Payer: Self-pay | Admitting: Emergency Medicine

## 2022-03-07 ENCOUNTER — Ambulatory Visit (HOSPITAL_COMMUNITY): Payer: 59

## 2022-03-07 NOTE — Telephone Encounter (Signed)
Reaching out to patient to offer assistance regarding upcoming cardiac imaging study; pt verbalizes understanding of appt date/time, parking situation and where to check in, pre-test NPO status and medications ordered, and verified current allergies; name and call back number provided for further questions should they arise ?Tylan Briguglio RN Navigator Cardiac Imaging ?[facility] ?[phone] office ?[phone] cell ? ?Denies iv issues ?Daily meds ?Arrival 100 ? ?

## 2022-03-10 ENCOUNTER — Ambulatory Visit (HOSPITAL_COMMUNITY)
Admission: RE | Admit: 2022-03-10 | Discharge: 2022-03-10 | Disposition: A | Discharge status: Home / Self Care | Payer: 59 | Source: Ambulatory Visit | Attending: Cardiology | Admitting: Cardiology

## 2022-03-10 DIAGNOSIS — Z01818 Encounter for other preprocedural examination: Secondary | ICD-10-CM | POA: Insufficient documentation

## 2022-03-10 DIAGNOSIS — I480 Paroxysmal atrial fibrillation: Secondary | ICD-10-CM | POA: Insufficient documentation

## 2022-03-10 MED ORDER — IOHEXOL 350 MG/ML SOLN
100.0000 mL | Freq: Once | INTRAVENOUS | Status: AC | PRN
  Administered 2022-03-10: 100 mL via INTRAVENOUS

## 2022-03-11 ENCOUNTER — Telehealth: Payer: Self-pay | Admitting: Cardiology

## 2022-03-11 NOTE — Telephone Encounter (Signed)
Pt calling to ask if ablation will still be done on Friday given his current CT results. Pt states that if it is not going to be done he would like a call b/c he plans on going out of town on [place]. Please advise.

## 2022-03-11 NOTE — Telephone Encounter (Signed)
Returned call to pt who request that we call him back in 10 mins.

## 2022-03-11 NOTE — Telephone Encounter (Signed)
Noted on CT:  IMPRESSION: 1. There is normal pulmonary vein drainage into the left atrium.   2. There is no thrombus in the left atrial appendage.  Advised patient based on this he should be good to go with the ablation, but that Dr. Quentin Ore will have to give the final yes. Also noted that Dr. Quentin Ore can talk with him about this at his video visit scheduled for tomorrow. ( 7/19)

## 2022-03-11 NOTE — Telephone Encounter (Signed)
New Message:   Patient says he have an urgent question about his Ablation that he is scheduled for on Friday.(03-14-22)

## 2022-03-12 ENCOUNTER — Telehealth (INDEPENDENT_AMBULATORY_CARE_PROVIDER_SITE_OTHER): Payer: 59 | Admitting: Cardiology

## 2022-03-12 ENCOUNTER — Telehealth: Payer: Self-pay

## 2022-03-12 VITALS — Ht 70.0 in | Wt 188.0 lb

## 2022-03-12 DIAGNOSIS — I480 Paroxysmal atrial fibrillation: Secondary | ICD-10-CM

## 2022-03-12 NOTE — H&P (View-Only) (Signed)
Virtual Visit via Video Note   Because of [name]'s co-morbid illnesses, he is at least at moderate risk for complications without adequate follow up.  This format is felt to be most appropriate for this patient at this time.  All issues noted in this document were discussed and addressed.  A limited physical exam was performed with this format.  Please refer to the patient's chart for his consent to telehealth for [facility].       Date:  03/12/2022   ID:  Dylan Hale, DOB 04/24/1951, MRN [id number] The patient was identified using 2 identifiers.  Patient Location: Home Provider Location: Office/Clinic   PCP:  Bayard Beaver., MD   [place] [facility] Cardiologist:  Kirk Ruths, MD Electrophysiologist:  Vickie Epley, MD     Evaluation Performed:  Follow-Up Visit  Chief Complaint:  AF  History of Present Illness:    Dylan Hale is a 71 y.o. male with atrial fibrillation who presents via virtual visit to discuss recent CT scan results prior to planned ablation this week.  I last saw the patient November 27, 2021.  From my last office visit note: "Dylan Hale is a 71 y.o. male who presents for a follow up visit. They were last seen in clinic April 23, 2021 for paroxysmal atrial fibrillation.  At that visit he had a low burden but highly symptomatic paroxysms of atrial fibrillation.  He is on Eliquis for stroke prophylaxis.  At that appointment he wished to pursue a conservative treatment strategy and avoid invasive procedures so we plan to see him back in 6 months.  He is on metoprolol but this is because sinus bradycardia with rates in the low 40s.   He tells me that his atrial fibrillation episodes have come in clusters.  The most recent cluster of atrial fibrillation was 3 weeks ago.  The longest episode was 41 hours.  He feels "off" after an episode of atrial fibrillation has resolved.  He continues to take Eliquis for stroke  prophylaxis."  He tells me he has been doing okay.  He is interested in pursuing both ablation and watchman implant.   Past Medical History:  Diagnosis Date   Anxiety    Chest pain    Depression    Enlarged prostate    Hyperlipidemia    Hypogonadism male    Hypothyroidism    Plantar fibromatosis    right foot   Past Surgical History:  Procedure Laterality Date   MASS EXCISION Right 08/02/2015   Procedure: EXCISION OF RIGHT FOOT MASS;  Surgeon: Wylene Simmer, MD;  Location: [place] [facility];  Service: Orthopedics;  Laterality: Right;   SHOULDER SURGERY Left    Arthroscopic     Current Meds  Medication Sig   acetaminophen (TYLENOL) 500 MG tablet Take 500 mg by mouth every 6 (six) hours as needed for moderate pain.   apixaban (ELIQUIS) 5 MG TABS tablet Take 1 tablet (5 mg total) by mouth 2 (two) times daily.   aspirin EC 81 MG tablet Take 81 mg by mouth daily.   Ca Carbonate-Mag Hydroxide (ROLAIDS PO) Take 2 tablets by mouth daily as needed (heartburn).   cetirizine (ZYRTEC) 10 MG tablet Take 10 mg by mouth daily.   Cholecalciferol (VITAMIN D) 50 MCG (2000 UT) tablet Take 2,000 Units by mouth daily.   clonazePAM (KLONOPIN) 0.5 MG tablet Take 0.25 mg by mouth 2 (two) times daily.   Coenzyme Q10 200 MG capsule Take 200  mg by mouth every other day. With the rosuvastatin   finasteride (PROSCAR) 5 MG tablet Take 5 mg by mouth daily.   levothyroxine (SYNTHROID, LEVOTHROID) 75 MCG tablet Take 75 mcg by mouth daily.   metoprolol succinate (TOPROL XL) 25 MG 24 hr tablet Take 12.5 mg (one-half tablet) each night.   Omega-3 Fatty Acids (FISH OIL) 1200 MG CAPS Take 1,200 mg by mouth daily.   PARoxetine (PAXIL) 20 MG tablet Take 20 mg by mouth daily.   rosuvastatin (CRESTOR) 10 MG tablet Take 10 mg by mouth every other day.   tamsulosin (FLOMAX) 0.4 MG CAPS capsule Take 0.4 mg by mouth daily.   Testosterone 20.25 MG/ACT (1.62%) GEL Apply 1-2 Pump topically every morning. On each  shoulder     Allergies:   Wound dressing adhesive and Sudafed [pseudoephedrine]   Social History   Tobacco Use   Smoking status: Never    Passive exposure: Past   Smokeless tobacco: Never  Vaping Use   Vaping Use: Never used  Substance Use Topics   Alcohol use: Yes    Alcohol/week: 2.0 - 3.0 standard drinks of alcohol    Types: 2 - 3 Cans of beer per week    Comment: socail settings 10/10/21   Drug use: No     Family Hx: The patient's family history includes Anxiety disorder in his mother; Autoimmune disease in his mother; Colon cancer in his maternal grandmother; Emphysema in his maternal grandfather; Lung cancer in his paternal grandfather; Prostate cancer in his father.  ROS:   Please see the history of present illness.     All other systems reviewed and are negative.   Prior CV studies:   The following studies were reviewed today:  March 10, 2022 cardiac CT personally reviewed IMPRESSION: 1. There is normal pulmonary vein drainage into the left atrium.   2. There is no thrombus in the left atrial appendage.   3. The esophagus runs in close proximity to the left inferior pulmonary vein ostium.   4. No PFO/ASD.   5. CAC score of 0    Labs/Other Tests and Data Reviewed:      Recent Labs: 02/20/2022: BUN 15; Creatinine, Ser 1.13; Hemoglobin 16.1; Platelets 191; Potassium 5.0; Sodium 140   Recent Lipid Panel No results found for: "CHOL", "TRIG", "HDL", "CHOLHDL", "LDLCALC", "LDLDIRECT"  Wt Readings from Last 3 Encounters:  03/12/22 188 lb (85.3 kg)  11/27/21 191 lb (86.6 kg)  10/21/21 191 lb (86.6 kg)     Objective:    Vital Signs:  Ht '5\' 10"'$  (1.778 m)   Wt 188 lb (85.3 kg)   BMI 26.98 kg/m    VITAL SIGNS:  reviewed  ASSESSMENT & PLAN:    #Paroxysmal atrial fibrillation Discussed treatment options today for his AF including antiarrhythmic drug therapy and ablation. Discussed risks, recovery and likelihood of success. Discussed potential need for  repeat ablation procedures and antiarrhythmic drugs after an initial ablation. They wish to proceed with scheduling.  Risk, benefits, and alternatives to EP study and radiofrequency ablation for afib were also discussed in detail today. These risks include but are not limited to stroke, bleeding, vascular damage, tamponade, perforation, damage to the esophagus, lungs, and other structures, pulmonary vein stenosis, worsening renal function, and death. The patient understands these risk and wishes to proceed.  We will therefore proceed with catheter ablation at the next available time.  Carto, ICE, anesthesia are requested for the procedure.  Will also obtain CT PV protocol prior  to the procedure to exclude LAA thrombus and further evaluate atrial anatomy.  -------------  The patient is also inquired about pursuing left atrial appendage occlusion as a mechanism to avoid long-term exposure anticoagulation.  The patient's CHA2DS2-VASc is 1 which currently is not high enough to pursue left atrial appendage occlusion.  For now would recommend continue anticoagulation.  We could address anticoagulation versus left atrial appendage occlusion in the future should the guidelines/recommendations change.  HAS-BLED score 1 Hypertension No  Abnormal renal and liver function (Dialysis, transplant, Cr >2.26 mg/dL /Cirrhosis or Bilirubin >2x Normal or AST/ALT/AP >3x Normal) No  Stroke No  Bleeding No  Labile INR (Unstable/high INR) No  Elderly (>65) Yes  Drugs or alcohol (? 8 drinks/week, anti-plt or NSAID) No   CHA2DS2-VASc Score = 1  The patient's score is based upon: CHF History: 0 HTN History: 0 Diabetes History: 0 Stroke History: 0 Vascular Disease History: 0 Age Score: 1 Gender Score: 0   Time:   Today, I have spent 16 minutes with the patient with telehealth technology discussing the above problems.    Cerebral but not a lot  Signed, Vickie Epley, MD  03/12/2022 9:30 PM    [place]

## 2022-03-12 NOTE — Progress Notes (Signed)
Virtual Visit via Video Note   Because of Dylan Hale's co-morbid illnesses, he is at least at moderate risk for complications without adequate follow up.  This format is felt to be most appropriate for this patient at this time.  All issues noted in this document were discussed and addressed.  A limited physical exam was performed with this format.  Please refer to the patient's chart for his consent to telehealth for [facility].       Date:  03/12/2022   ID:  Dylan Hale, DOB 18-Aug-1951, MRN [id number] The patient was identified using 2 identifiers.  Patient Location: Home Provider Location: Office/Clinic   PCP:  Bayard Beaver., MD   [place] [facility] Cardiologist:  Kirk Ruths, MD Electrophysiologist:  Vickie Epley, MD     Evaluation Performed:  Follow-Up Visit  Chief Complaint:  AF  History of Present Illness:    Dylan Hale is a 71 y.o. male with atrial fibrillation who presents via virtual visit to discuss recent CT scan results prior to planned ablation this week.  I last saw the patient November 27, 2021.  From my last office visit note: "Dylan Hale is a 70 y.o. male who presents for a follow up visit. They were last seen in clinic April 23, 2021 for paroxysmal atrial fibrillation.  At that visit he had a low burden but highly symptomatic paroxysms of atrial fibrillation.  He is on Eliquis for stroke prophylaxis.  At that appointment he wished to pursue a conservative treatment strategy and avoid invasive procedures so we plan to see him back in 6 months.  He is on metoprolol but this is because sinus bradycardia with rates in the low 40s.   He tells me that his atrial fibrillation episodes have come in clusters.  The most recent cluster of atrial fibrillation was 3 weeks ago.  The longest episode was 41 hours.  He feels "off" after an episode of atrial fibrillation has resolved.  He continues to take Eliquis for stroke  prophylaxis."  He tells me he has been doing okay.  He is interested in pursuing both ablation and watchman implant.   Past Medical History:  Diagnosis Date   Anxiety    Chest pain    Depression    Enlarged prostate    Hyperlipidemia    Hypogonadism male    Hypothyroidism    Plantar fibromatosis    right foot   Past Surgical History:  Procedure Laterality Date   MASS EXCISION Right 08/02/2015   Procedure: EXCISION OF RIGHT FOOT MASS;  Surgeon: Wylene Simmer, MD;  Location: [place];  Service: Orthopedics;  Laterality: Right;   SHOULDER SURGERY Left    Arthroscopic     Current Meds  Medication Sig   acetaminophen (TYLENOL) 500 MG tablet Take 500 mg by mouth every 6 (six) hours as needed for moderate pain.   apixaban (ELIQUIS) 5 MG TABS tablet Take 1 tablet (5 mg total) by mouth 2 (two) times daily.   aspirin EC 81 MG tablet Take 81 mg by mouth daily.   Ca Carbonate-Mag Hydroxide (ROLAIDS PO) Take 2 tablets by mouth daily as needed (heartburn).   cetirizine (ZYRTEC) 10 MG tablet Take 10 mg by mouth daily.   Cholecalciferol (VITAMIN D) 50 MCG (2000 UT) tablet Take 2,000 Units by mouth daily.   clonazePAM (KLONOPIN) 0.5 MG tablet Take 0.25 mg by mouth 2 (two) times daily.   Coenzyme Q10 200 MG capsule Take 200  mg by mouth every other day. With the rosuvastatin   finasteride (PROSCAR) 5 MG tablet Take 5 mg by mouth daily.   levothyroxine (SYNTHROID, LEVOTHROID) 75 MCG tablet Take 75 mcg by mouth daily.   metoprolol succinate (TOPROL XL) 25 MG 24 hr tablet Take 12.5 mg (one-half tablet) each night.   Omega-3 Fatty Acids (FISH OIL) 1200 MG CAPS Take 1,200 mg by mouth daily.   PARoxetine (PAXIL) 20 MG tablet Take 20 mg by mouth daily.   rosuvastatin (CRESTOR) 10 MG tablet Take 10 mg by mouth every other day.   tamsulosin (FLOMAX) 0.4 MG CAPS capsule Take 0.4 mg by mouth daily.   Testosterone 20.25 MG/ACT (1.62%) GEL Apply 1-2 Pump topically every morning. On each  shoulder     Allergies:   Wound dressing adhesive and Sudafed [pseudoephedrine]   Social History   Tobacco Use   Smoking status: Never    Passive exposure: Past   Smokeless tobacco: Never  Vaping Use   Vaping Use: Never used  Substance Use Topics   Alcohol use: Yes    Alcohol/week: 2.0 - 3.0 standard drinks of alcohol    Types: 2 - 3 Cans of beer per week    Comment: socail settings 10/10/21   Drug use: No     Family Hx: The patient's family history includes Anxiety disorder in his mother; Autoimmune disease in his mother; Colon cancer in his maternal grandmother; Emphysema in his maternal grandfather; Lung cancer in his paternal grandfather; Prostate cancer in his father.  ROS:   Please see the history of present illness.     All other systems reviewed and are negative.   Prior CV studies:   The following studies were reviewed today:  March 10, 2022 cardiac CT personally reviewed IMPRESSION: 1. There is normal pulmonary vein drainage into the left atrium.   2. There is no thrombus in the left atrial appendage.   3. The esophagus runs in close proximity to the left inferior pulmonary vein ostium.   4. No PFO/ASD.   5. CAC score of 0    Labs/Other Tests and Data Reviewed:      Recent Labs: 02/20/2022: BUN 15; Creatinine, Ser 1.13; Hemoglobin 16.1; Platelets 191; Potassium 5.0; Sodium 140   Recent Lipid Panel No results found for: "CHOL", "TRIG", "HDL", "CHOLHDL", "LDLCALC", "LDLDIRECT"  Wt Readings from Last 3 Encounters:  03/12/22 188 lb (85.3 kg)  11/27/21 191 lb (86.6 kg)  10/21/21 191 lb (86.6 kg)     Objective:    Vital Signs:  Ht '5\' 10"'$  (1.778 m)   Wt 188 lb (85.3 kg)   BMI 26.98 kg/m    VITAL SIGNS:  reviewed  ASSESSMENT & PLAN:    #Paroxysmal atrial fibrillation Discussed treatment options today for his AF including antiarrhythmic drug therapy and ablation. Discussed risks, recovery and likelihood of success. Discussed potential need for  repeat ablation procedures and antiarrhythmic drugs after an initial ablation. They wish to proceed with scheduling.  Risk, benefits, and alternatives to EP study and radiofrequency ablation for afib were also discussed in detail today. These risks include but are not limited to stroke, bleeding, vascular damage, tamponade, perforation, damage to the esophagus, lungs, and other structures, pulmonary vein stenosis, worsening renal function, and death. The patient understands these risk and wishes to proceed.  We will therefore proceed with catheter ablation at the next available time.  Carto, ICE, anesthesia are requested for the procedure.  Will also obtain CT PV protocol prior  to the procedure to exclude LAA thrombus and further evaluate atrial anatomy.  -------------  The patient is also inquired about pursuing left atrial appendage occlusion as a mechanism to avoid long-term exposure anticoagulation.  The patient's CHA2DS2-VASc is 1 which currently is not high enough to pursue left atrial appendage occlusion.  For now would recommend continue anticoagulation.  We could address anticoagulation versus left atrial appendage occlusion in the future should the guidelines/recommendations change.  HAS-BLED score 1 Hypertension No  Abnormal renal and liver function (Dialysis, transplant, Cr >2.26 mg/dL /Cirrhosis or Bilirubin >2x Normal or AST/ALT/AP >3x Normal) No  Stroke No  Bleeding No  Labile INR (Unstable/high INR) No  Elderly (>65) Yes  Drugs or alcohol (? 8 drinks/week, anti-plt or NSAID) No   CHA2DS2-VASc Score = 1  The patient's score is based upon: CHF History: 0 HTN History: 0 Diabetes History: 0 Stroke History: 0 Vascular Disease History: 0 Age Score: 1 Gender Score: 0   Time:   Today, I have spent 16 minutes with the patient with telehealth technology discussing the above problems.    Cerebral but not a lot  Signed, Vickie Epley, MD  03/12/2022 9:30 PM    [place]

## 2022-03-12 NOTE — Telephone Encounter (Signed)
Per [employer] review, "Watchman CT ([employer]) report for [employer] *Shallow, could be tricky if the 29m isn't big enough *  Chicken Wing morphology  Ostium of 259mAVG and 2326mAX Depth to around 54m81mnferior (SI) and mid (AP) stick, a double curve sheath, and a 24mm41mchman FLX"

## 2022-03-13 NOTE — Anesthesia Preprocedure Evaluation (Addendum)
Anesthesia Evaluation  Patient identified by MRN, date of birth, ID band Patient awake    Reviewed: Allergy & Precautions, NPO status , Patient's Chart, lab work & pertinent test results  History of Anesthesia Complications Negative for: history of anesthetic complications  Airway Mallampati: II  TM Distance: >3 FB Neck ROM: Full    Dental no notable dental hx. (+) Dental Advisory Given   Pulmonary neg pulmonary ROS,    Pulmonary exam normal        Cardiovascular + dysrhythmias Atrial Fibrillation  Rhythm:Regular Rate:Normal  03/27/2021 Narrative  Good exercise capacity, achieved 10.1 METS  Normal BP response to exercise  Peak HR 153 bpm, 101% max predicted HR  Upsloping ST segment depression was noted during stress. No evidence of  ischemia  Nuclear stress EF: 62%.  The left ventricular ejection fraction is normal (55-65%).  The study is normal.  This is a low risk study.  1. Fixed inferior perfusion defect with normal wall motion, consistent  with artifact 2. Low risk study   Neuro/Psych PSYCHIATRIC DISORDERS Anxiety Depression negative neurological ROS     GI/Hepatic negative GI ROS, Neg liver ROS,   Endo/Other  Hypothyroidism   Renal/GU negative Renal ROS     Musculoskeletal negative musculoskeletal ROS (+)   Abdominal   Peds  Hematology negative hematology ROS (+)   Anesthesia Other Findings   Reproductive/Obstetrics                            Anesthesia Physical Anesthesia Plan  ASA: 3  Anesthesia Plan: General   Post-op Pain Management: Celebrex PO (pre-op)* and Tylenol PO (pre-op)*   Induction: Intravenous  PONV Risk Score and Plan: 2 and Ondansetron and Midazolam  Airway Management Planned: Oral ETT  Additional Equipment:   Intra-op Plan:   Post-operative Plan: Extubation in OR  Informed Consent: I have reviewed the patients History and Physical,  chart, labs and discussed the procedure including the risks, benefits and alternatives for the proposed anesthesia with the patient or authorized representative who has indicated his/her understanding and acceptance.     Dental advisory given  Plan Discussed with: Anesthesiologist and CRNA  Anesthesia Plan Comments:        Anesthesia Quick Evaluation

## 2022-03-14 ENCOUNTER — Ambulatory Visit (HOSPITAL_BASED_OUTPATIENT_CLINIC_OR_DEPARTMENT_OTHER): Payer: 59 | Admitting: Anesthesiology

## 2022-03-14 ENCOUNTER — Other Ambulatory Visit: Payer: Self-pay

## 2022-03-14 ENCOUNTER — Ambulatory Visit (HOSPITAL_COMMUNITY)
Admission: RE | Admit: 2022-03-14 | Discharge: 2022-03-14 | Disposition: A | Discharge status: Home / Self Care | Payer: 59 | Attending: Cardiology | Admitting: Cardiology

## 2022-03-14 ENCOUNTER — Encounter (HOSPITAL_COMMUNITY)
Admission: RE | Disposition: A | Discharge status: Home / Self Care | Payer: Self-pay | Source: Home / Self Care | Attending: Cardiology

## 2022-03-14 ENCOUNTER — Ambulatory Visit (HOSPITAL_COMMUNITY): Payer: 59 | Admitting: Anesthesiology

## 2022-03-14 ENCOUNTER — Encounter (HOSPITAL_COMMUNITY): Payer: Self-pay | Admitting: Cardiology

## 2022-03-14 DIAGNOSIS — Z7901 Long term (current) use of anticoagulants: Secondary | ICD-10-CM | POA: Insufficient documentation

## 2022-03-14 DIAGNOSIS — E039 Hypothyroidism, unspecified: Secondary | ICD-10-CM | POA: Diagnosis not present

## 2022-03-14 DIAGNOSIS — I4891 Unspecified atrial fibrillation: Secondary | ICD-10-CM

## 2022-03-14 DIAGNOSIS — Z79899 Other long term (current) drug therapy: Secondary | ICD-10-CM | POA: Insufficient documentation

## 2022-03-14 DIAGNOSIS — I480 Paroxysmal atrial fibrillation: Secondary | ICD-10-CM | POA: Diagnosis present

## 2022-03-14 DIAGNOSIS — F418 Other specified anxiety disorders: Secondary | ICD-10-CM

## 2022-03-14 HISTORY — PX: ATRIAL FIBRILLATION ABLATION: EP1191

## 2022-03-14 LAB — POCT ACTIVATED CLOTTING TIME
Activated Clotting Time: 257 seconds
Activated Clotting Time: 305 seconds

## 2022-03-14 SURGERY — ATRIAL FIBRILLATION ABLATION
Anesthesia: General

## 2022-03-14 MED ORDER — SODIUM CHLORIDE 0.9 % IV SOLN: INTRAVENOUS | Status: DC

## 2022-03-14 MED ORDER — HEPARIN (PORCINE) IN NACL 1000-0.9 UT/500ML-% IV SOLN
INTRAVENOUS | Status: AC
Start: 2022-03-14 — End: ?
  Filled 2022-03-14: qty 1500

## 2022-03-14 MED ORDER — PHENYLEPHRINE HCL-NACL 20-0.9 MG/250ML-% IV SOLN
INTRAVENOUS | Status: DC | PRN
  Administered 2022-03-14: 25 ug/min via INTRAVENOUS

## 2022-03-14 MED ORDER — SODIUM CHLORIDE 0.9% FLUSH: 3.0000 mL | INTRAVENOUS | Status: DC | PRN

## 2022-03-14 MED ORDER — EPHEDRINE SULFATE-NACL 50-0.9 MG/10ML-% IV SOSY
PREFILLED_SYRINGE | INTRAVENOUS | Status: DC | PRN
  Administered 2022-03-14: 10 mg via INTRAVENOUS

## 2022-03-14 MED ORDER — HEPARIN (PORCINE) IN NACL 1000-0.9 UT/500ML-% IV SOLN
INTRAVENOUS | Status: DC | PRN
  Administered 2022-03-14 (×3): 500 mL

## 2022-03-14 MED ORDER — PHENYLEPHRINE 80 MCG/ML (10ML) SYRINGE FOR IV PUSH (FOR BLOOD PRESSURE SUPPORT)
PREFILLED_SYRINGE | INTRAVENOUS | Status: DC | PRN
  Administered 2022-03-14 (×3): 160 ug via INTRAVENOUS

## 2022-03-14 MED ORDER — FENTANYL CITRATE (PF) 100 MCG/2ML IJ SOLN
INTRAMUSCULAR | Status: DC | PRN
  Administered 2022-03-14: 100 ug via INTRAVENOUS

## 2022-03-14 MED ORDER — HEPARIN SODIUM (PORCINE) 1000 UNIT/ML IJ SOLN
INTRAMUSCULAR | Status: DC | PRN
  Administered 2022-03-14: 1000 [IU] via INTRAVENOUS

## 2022-03-14 MED ORDER — ONDANSETRON HCL 4 MG/2ML IJ SOLN: 4.0000 mg | Freq: Four times a day (QID) | INTRAMUSCULAR | Status: DC | PRN

## 2022-03-14 MED ORDER — SUGAMMADEX SODIUM 200 MG/2ML IV SOLN
INTRAVENOUS | Status: DC | PRN
  Administered 2022-03-14: 200 mg via INTRAVENOUS

## 2022-03-14 MED ORDER — HEPARIN SODIUM (PORCINE) 1000 UNIT/ML IJ SOLN
INTRAMUSCULAR | Status: DC | PRN
  Administered 2022-03-14: 4000 [IU] via INTRAVENOUS
  Administered 2022-03-14: 12000 [IU] via INTRAVENOUS
  Administered 2022-03-14: 5000 [IU] via INTRAVENOUS

## 2022-03-14 MED ORDER — MIDAZOLAM HCL 2 MG/2ML IJ SOLN
INTRAMUSCULAR | Status: DC | PRN
  Administered 2022-03-14: 2 mg via INTRAVENOUS

## 2022-03-14 MED ORDER — SODIUM CHLORIDE 0.9 % IV SOLN: 250.0000 mL | INTRAVENOUS | Status: DC | PRN

## 2022-03-14 MED ORDER — LIDOCAINE 2% (20 MG/ML) 5 ML SYRINGE
INTRAMUSCULAR | Status: DC | PRN
  Administered 2022-03-14: 100 mg via INTRAVENOUS

## 2022-03-14 MED ORDER — PROPOFOL 10 MG/ML IV BOLUS
INTRAVENOUS | Status: DC | PRN
  Administered 2022-03-14: 160 mg via INTRAVENOUS

## 2022-03-14 MED ORDER — ACETAMINOPHEN 325 MG PO TABS
650.0000 mg | ORAL_TABLET | ORAL | Status: DC | PRN
  Administered 2022-03-14: 650 mg via ORAL
  Filled 2022-03-14: qty 2

## 2022-03-14 MED ORDER — ROCURONIUM BROMIDE 10 MG/ML (PF) SYRINGE
PREFILLED_SYRINGE | INTRAVENOUS | Status: DC | PRN
  Administered 2022-03-14: 10 mg via INTRAVENOUS
  Administered 2022-03-14: 80 mg via INTRAVENOUS

## 2022-03-14 MED ORDER — SODIUM CHLORIDE 0.9% FLUSH: 3.0000 mL | Freq: Two times a day (BID) | INTRAVENOUS | Status: DC

## 2022-03-14 MED ORDER — PHENYLEPHRINE HCL-NACL 20-0.9 MG/250ML-% IV SOLN: INTRAVENOUS | Status: DC | PRN

## 2022-03-14 MED ORDER — COLCHICINE 0.6 MG PO TABS
0.6000 mg | ORAL_TABLET | Freq: Two times a day (BID) | ORAL | Status: DC
  Administered 2022-03-14: 0.6 mg via ORAL
  Filled 2022-03-14: qty 1

## 2022-03-14 MED ORDER — PANTOPRAZOLE SODIUM 40 MG PO TBEC: 40.0000 mg | DELAYED_RELEASE_TABLET | Freq: Every day | ORAL | 0 refills | Status: DC

## 2022-03-14 MED ORDER — DEXAMETHASONE SODIUM PHOSPHATE 10 MG/ML IJ SOLN
INTRAMUSCULAR | Status: DC | PRN
  Administered 2022-03-14: 5 mg via INTRAVENOUS

## 2022-03-14 MED ORDER — PROTAMINE SULFATE 10 MG/ML IV SOLN
INTRAVENOUS | Status: DC | PRN
  Administered 2022-03-14: 35 mg via INTRAVENOUS

## 2022-03-14 MED ORDER — PANTOPRAZOLE SODIUM 40 MG PO TBEC
40.0000 mg | DELAYED_RELEASE_TABLET | Freq: Every day | ORAL | Status: DC
  Administered 2022-03-14: 40 mg via ORAL
  Filled 2022-03-14: qty 1

## 2022-03-14 MED ORDER — COLCHICINE 0.6 MG PO TABS: 0.6000 mg | ORAL_TABLET | Freq: Two times a day (BID) | ORAL | 0 refills | Status: DC

## 2022-03-14 MED ORDER — HEPARIN SODIUM (PORCINE) 1000 UNIT/ML IJ SOLN
INTRAMUSCULAR | Status: AC
  Filled 2022-03-14: qty 10

## 2022-03-14 MED ORDER — APIXABAN 5 MG PO TABS
5.0000 mg | ORAL_TABLET | Freq: Two times a day (BID) | ORAL | Status: DC
  Administered 2022-03-14: 5 mg via ORAL
  Filled 2022-03-14: qty 1

## 2022-03-14 MED ORDER — ONDANSETRON HCL 4 MG/2ML IJ SOLN
INTRAMUSCULAR | Status: DC | PRN
  Administered 2022-03-14: 4 mg via INTRAVENOUS

## 2022-03-14 SURGICAL SUPPLY — 18 items
CATH 8FR REPROCESSED SOUNDSTAR (CATHETERS) ×2 IMPLANT
CATH 8FR SOUNDSTAR REPROCESSED (CATHETERS) IMPLANT
CATH ABLAT QDOT MICRO BI TC DF (CATHETERS) ×1 IMPLANT
CATH OCTARAY 2.0 F 3-3-3-3-3 (CATHETERS) ×1 IMPLANT
CATH S CIRCA THERM PROBE 10F (CATHETERS) ×1 IMPLANT
CATH WEB BI DIR CSDF CRV REPRO (CATHETERS) ×1 IMPLANT
CLOSURE PERCLOSE PROSTYLE (VASCULAR PRODUCTS) ×3 IMPLANT
COVER SWIFTLINK CONNECTOR (BAG) ×2 IMPLANT
PACK EP LATEX FREE (CUSTOM PROCEDURE TRAY) ×1
PACK EP LF (CUSTOM PROCEDURE TRAY) ×1 IMPLANT
PAD DEFIB RADIO PHYSIO CONN (PAD) ×2 IMPLANT
PATCH CARTO3 (PAD) ×1 IMPLANT
SHEATH BAYLIS TRANSSEPTAL 98CM (NEEDLE) ×1 IMPLANT
SHEATH CARTO VIZIGO SM CVD (SHEATH) ×1 IMPLANT
SHEATH PINNACLE 8F 10CM (SHEATH) ×2 IMPLANT
SHEATH PINNACLE 9F 10CM (SHEATH) ×1 IMPLANT
SHEATH PROBE COVER 6X72 (BAG) ×1 IMPLANT
TUBING SMART ABLATE COOLFLOW (TUBING) ×1 IMPLANT

## 2022-03-14 NOTE — Discharge Instructions (Addendum)
Post procedure care instructions No driving for 4 days. No lifting over 5 lbs for 1 week. No vigorous or sexual activity for 1 week. You may return to work/your usual activities on 03/12/22. Keep procedure site clean & dry. If you notice increased pain, swelling, bleeding or pus, call/return!  You may shower after 24 hours, but no soaking in baths/hot tubs/pools for 1 week.    You have an appointment set up with the [facility].  Multiple studies have shown that being followed by a dedicated atrial fibrillation clinic in addition to the standard care you receive from your other physicians improves health. We believe that enrollment in the atrial fibrillation clinic will allow [place] to better care for you.   The phone number to the [facility] is [phone]. The clinic is staffed Monday through Friday from 8:30am to 5pm.  Parking Directions: The clinic is located in the Heart and Vascular Building connected to [facility]. 1)From [address] turn on to [address] and go to the 3rd entrance  (Heart and Vascular entrance) on the right. 2)Look to the right for Heart &Vascular Parking Garage. 3)A code for the entrance is required, for August is 1403.   4)Take the elevators to the 1st floor. Registration is in the room with the glass walls at the end of the hallway.  If you have any trouble parking or locating the clinic, please don't hesitate to call [phone].  Cardiac Ablation, Care After  This sheet gives you information about how to care for yourself after your procedure. Your health care provider may also give you more specific instructions. If you have problems or questions, contact your health care provider. What can I expect after the procedure? After the procedure, it is common to have: Bruising around your puncture site. Tenderness around your puncture site. Skipped heartbeats. Tiredness (fatigue).  Follow these instructions at  home: Puncture site care  Follow instructions from your health care provider about how to take care of your puncture site. Make sure you: If present, leave stitches (sutures), skin glue, or adhesive strips in place. These skin closures may need to stay in place for up to 2 weeks. If adhesive strip edges start to loosen and curl up, you may trim the loose edges. Do not remove adhesive strips completely unless your health care provider tells you to do that. If a large square bandage is present, this may be removed 24 hours after surgery.  Check your puncture site every day for signs of infection. Check for: Redness, swelling, or pain. Fluid or blood. If your puncture site starts to bleed, lie down on your back, apply firm pressure to the area, and contact your health care provider. Warmth. Pus or a bad smell. A pea or small marble sized lump at the site is normal and can take up to three months to resolve.  Driving Do not drive for at least 4 days after your procedure or however long your health care provider recommends. (Do not resume driving if you have previously been instructed not to drive for other health reasons.) Do not drive or use heavy machinery while taking prescription pain medicine. Activity Avoid activities that take a lot of effort for at least 7 days after your procedure. Do not lift anything that is heavier than 5 lb (4.5 kg) for one week.  No sexual activity for 1 week.  Return to your normal activities as told by your health care provider. Ask your health care  provider what activities are safe for you. General instructions Take over-the-counter and prescription medicines only as told by your health care provider. Do not use any products that contain nicotine or tobacco, such as cigarettes and e-cigarettes. If you need help quitting, ask your health care provider. You may shower after 24 hours, but Do not take baths, swim, or use a hot tub for 1 week.  Do not drink alcohol for  24 hours after your procedure. Keep all follow-up visits as told by your health care provider. This is important. Contact a health care provider if: You have redness, mild swelling, or pain around your puncture site. You have fluid or blood coming from your puncture site that stops after applying firm pressure to the area. Your puncture site feels warm to the touch. You have pus or a bad smell coming from your puncture site. You have a fever. You have chest pain or discomfort that spreads to your neck, jaw, or arm. You are sweating a lot. You feel nauseous. You have a fast or irregular heartbeat. You have shortness of breath. You are dizzy or light-headed and feel the need to lie down. You have pain or numbness in the arm or leg closest to your puncture site. Get help right away if: Your puncture site suddenly swells. Your puncture site is bleeding and the bleeding does not stop after applying firm pressure to the area. These symptoms may represent a serious problem that is an emergency. Do not wait to see if the symptoms will go away. Get medical help right away. Call your local emergency services (911 in the [place] Do not drive yourself to the hospital. Summary After the procedure, it is normal to have bruising and tenderness at the puncture site in your groin, neck, or forearm. Check your puncture site every day for signs of infection. Get help right away if your puncture site is bleeding and the bleeding does not stop after applying firm pressure to the area. This is a medical emergency. This information is not intended to replace advice given to you by your health care provider. Make sure you discuss any questions you have with your health care provider.

## 2022-03-14 NOTE — Anesthesia Postprocedure Evaluation (Signed)
Anesthesia Post Note  Patient: Dylan Hale  Procedure(s) Performed: ATRIAL FIBRILLATION ABLATION     Patient location during evaluation: PACU Anesthesia Type: General Level of consciousness: sedated Pain management: pain level controlled Vital Signs Assessment: post-procedure vital signs reviewed and stable Respiratory status: spontaneous breathing and respiratory function stable Cardiovascular status: stable Postop Assessment: no apparent nausea or vomiting Anesthetic complications: no   There were no known notable events for this encounter.  Last Vitals:  Vitals:   03/14/22 1350 03/14/22 1400  BP: 112/80 116/86  Pulse: (!) 57 60  Resp: 13 18  Temp:    SpO2: 95% 93%    Last Pain:  Vitals:   03/14/22 1354  TempSrc:   PainSc: 5                  Nolin Grell DANIEL

## 2022-03-14 NOTE — Anesthesia Procedure Notes (Signed)
Procedure Name: Intubation Date/Time: 03/14/2022 10:50 AM  Performed by: Moshe Salisbury, CRNAPre-anesthesia Checklist: Patient identified, Emergency Drugs available, Suction available and Patient being monitored Patient Re-evaluated:Patient Re-evaluated prior to induction Oxygen Delivery Method: Circle System Utilized Preoxygenation: Pre-oxygenation with 100% oxygen Induction Type: IV induction Ventilation: Mask ventilation without difficulty Laryngoscope Size: Mac and 4 Grade View: Grade I Tube type: Oral Tube size: 8.0 mm Number of attempts: 1 Airway Equipment and Method: Stylet Placement Confirmation: ETT inserted through vocal cords under direct vision, positive ETCO2 and breath sounds checked- equal and bilateral Secured at: 22 cm Tube secured with: Tape Dental Injury: Teeth and Oropharynx as per pre-operative assessment

## 2022-03-14 NOTE — Transfer of Care (Signed)
Immediate Anesthesia Transfer of Care Note  Patient: Dylan Hale  Procedure(s) Performed: ATRIAL FIBRILLATION ABLATION  Patient Location: Cath Lab  Anesthesia Type:General  Level of Consciousness: drowsy and patient cooperative  Airway & Oxygen Therapy: Patient Spontanous Breathing and Patient connected to nasal cannula oxygen  Post-op Assessment: Report given to RN, Post -op Vital signs reviewed and stable and Patient moving all extremities X 4  Post vital signs: Reviewed and stable  Last Vitals:  Vitals Value Taken Time  BP 115/72 03/14/22 1306  Temp    Pulse 60 03/14/22 1307  Resp 9 03/14/22 1307  SpO2 100 % 03/14/22 1307  Vitals shown include unvalidated device data.  Last Pain:  Vitals:   03/14/22 0939  TempSrc:   PainSc: 0-No pain         Complications: There were no known notable events for this encounter.

## 2022-03-14 NOTE — Interval H&P Note (Signed)
History and Physical Interval Note:  03/14/2022 10:19 AM  Dylan Hale  has presented today for surgery, with the diagnosis of afib.  The various methods of treatment have been discussed with the patient and family. After consideration of risks, benefits and other options for treatment, the patient has consented to  Procedure(s): ATRIAL FIBRILLATION ABLATION (N/A) as a surgical intervention.  The patient's history has been reviewed, patient examined, no change in status, stable for surgery.  I have reviewed the patient's chart and labs.  Questions were answered to the patient's satisfaction.    Presents for PVI. Procedure reviewed.  Discussed treatment options today for his AF including antiarrhythmic drug therapy and ablation. Discussed risks, recovery and likelihood of success. Discussed potential need for repeat ablation procedures and antiarrhythmic drugs after an initial ablation. They wish to proceed with scheduling.  Risk, benefits, and alternatives to EP study and radiofrequency ablation for afib were also discussed in detail today. These risks include but are not limited to stroke, bleeding, vascular damage, tamponade, perforation, damage to the esophagus, lungs, and other structures, pulmonary vein stenosis, worsening renal function, and death. The patient understands these risk and wishes to proceed.  We will therefore proceed with catheter ablation at the next available time.  Carto, ICE, anesthesia are requested for the procedure. Aryahna Spagna T Zuriah Bordas

## 2022-03-17 ENCOUNTER — Encounter: Payer: Self-pay | Admitting: Cardiology

## 2022-03-17 ENCOUNTER — Encounter (HOSPITAL_COMMUNITY): Payer: Self-pay | Admitting: Cardiology

## 2022-04-06 ENCOUNTER — Other Ambulatory Visit: Payer: Self-pay | Admitting: Physician Assistant

## 2022-04-10 ENCOUNTER — Ambulatory Visit (HOSPITAL_COMMUNITY)
Admission: RE | Admit: 2022-04-10 | Discharge: 2022-04-10 | Disposition: A | Discharge status: Home / Self Care | Payer: 59 | Source: Ambulatory Visit | Attending: Physician Assistant | Admitting: Physician Assistant

## 2022-04-10 ENCOUNTER — Encounter (HOSPITAL_COMMUNITY): Payer: Self-pay | Admitting: Physician Assistant

## 2022-04-10 VITALS — BP 132/90 | HR 53 | Ht 70.0 in | Wt 190.4 lb

## 2022-04-10 DIAGNOSIS — Z7901 Long term (current) use of anticoagulants: Secondary | ICD-10-CM | POA: Diagnosis not present

## 2022-04-10 DIAGNOSIS — E039 Hypothyroidism, unspecified: Secondary | ICD-10-CM | POA: Insufficient documentation

## 2022-04-10 DIAGNOSIS — G4733 Obstructive sleep apnea (adult) (pediatric): Secondary | ICD-10-CM | POA: Diagnosis not present

## 2022-04-10 DIAGNOSIS — I480 Paroxysmal atrial fibrillation: Secondary | ICD-10-CM | POA: Diagnosis present

## 2022-04-10 DIAGNOSIS — E785 Hyperlipidemia, unspecified: Secondary | ICD-10-CM | POA: Diagnosis not present

## 2022-04-10 NOTE — Progress Notes (Signed)
Primary Care Physician: Bayard Beaver., MD Primary Cardiologist: Dr Stanford Breed Primary Electrophysiologist: Dr Quentin Ore Referring Physician: Oda Kilts PA   Dylan Hale is a 71 y.o. male with a history of HLD, hypothyroidism, OSA, atrial fibrillation who presents for follow up in the [facility]. The patient presented to the emergency department on February 24, 2021 in [address] after a syncopal episode.  He was found to be in atrial fibrillation with RVR.  He was ultimately cardioverted using IV Cardizem. Patient is on Eliquis for a CHADS2VASC score of 1. He contacted [facility] 10/08/21 with palpitations and fatigue. He was started on Toprol and he converted back to SR. Patient is s/p afib ablation with Dr Quentin Ore on 03/12/22.  On follow up today, patient reports that he has done well since the ablation with no episodes of afib. He denies any CP, swallowing pain, or groin issues. No bleeding issues on anticoagulation. He does feel fatigued at times which he attributes to the BB.  Today, he denies symptoms of palpitations, chest pain, shortness of breath, orthopnea, PND, lower extremity edema, dizziness, presyncope, syncope, bleeding, or neurologic sequela. The patient is tolerating medications without difficulties and is otherwise without complaint today.    Atrial Fibrillation Risk Factors:  he does have symptoms or diagnosis of sleep apnea. he is compliant with CPAP therapy. he does not have a history of rheumatic fever. he does have a history of alcohol use.   he has a BMI of Body mass index is 27.32 kg/m.Marland Kitchen Filed Weights   04/10/22 1100  Weight: 86.4 kg    Family History  Problem Relation Age of Onset   Prostate cancer Father    Anxiety disorder Mother    Autoimmune disease Mother    Colon cancer Maternal Grandmother    Emphysema Maternal Grandfather    Lung cancer Paternal Grandfather      Atrial Fibrillation Management history:  Previous  antiarrhythmic drugs: none Previous cardioversions: none Previous ablations: 03/12/22 CHADS2VASC score: 1 Anticoagulation history: Eliquis   Past Medical History:  Diagnosis Date   Anxiety    Chest pain    Depression    Enlarged prostate    Hyperlipidemia    Hypogonadism male    Hypothyroidism    Plantar fibromatosis    right foot   Past Surgical History:  Procedure Laterality Date   ATRIAL FIBRILLATION ABLATION N/A 03/14/2022   Procedure: ATRIAL FIBRILLATION ABLATION;  Surgeon: Vickie Epley, MD;  Location: [facility];  Service: Cardiovascular;  Laterality: N/A;   MASS EXCISION Right 08/02/2015   Procedure: EXCISION OF RIGHT FOOT MASS;  Surgeon: Wylene Simmer, MD;  Location: [facility];  Service: Orthopedics;  Laterality: Right;   SHOULDER SURGERY Left    Arthroscopic    Current Outpatient Medications  Medication Sig Dispense Refill   acetaminophen (TYLENOL) 500 MG tablet Take 500 mg by mouth every 6 (six) hours as needed for moderate pain.     apixaban (ELIQUIS) 5 MG TABS tablet Take 1 tablet (5 mg total) by mouth 2 (two) times daily. 60 tablet 5   aspirin EC 81 MG tablet Take 81 mg by mouth daily.     Ca Carbonate-Mag Hydroxide (ROLAIDS PO) Take 2 tablets by mouth daily as needed (heartburn).     cetirizine (ZYRTEC) 10 MG tablet Take 10 mg by mouth daily.     Cholecalciferol (VITAMIN D) 50 MCG (2000 UT) tablet Take 2,000 Units by mouth daily.  clonazePAM (KLONOPIN) 0.5 MG tablet Take 0.25 mg by mouth 2 (two) times daily.     Coenzyme Q10 200 MG capsule Take 200 mg by mouth every other day. With the rosuvastatin     finasteride (PROSCAR) 5 MG tablet Take 5 mg by mouth daily.     levothyroxine (SYNTHROID, LEVOTHROID) 75 MCG tablet Take 75 mcg by mouth daily.     metoprolol succinate (TOPROL XL) 25 MG 24 hr tablet Take 12.5 mg (one-half tablet) each night. 30 tablet 2   Omega-3 Fatty Acids (FISH OIL) 1200 MG CAPS Take 1,200 mg by mouth daily.      pantoprazole (PROTONIX) 40 MG tablet Take 1 tablet (40 mg total) by mouth daily. 45 tablet 0   PARoxetine (PAXIL) 20 MG tablet Take 20 mg by mouth daily.     rosuvastatin (CRESTOR) 10 MG tablet Take 10 mg by mouth every other day.     tamsulosin (FLOMAX) 0.4 MG CAPS capsule Take 0.4 mg by mouth daily.     Testosterone 20.25 MG/ACT (1.62%) GEL Apply 1-2 Pump topically every morning. On each shoulder     colchicine 0.6 MG tablet Take 1 tablet (0.6 mg total) by mouth 2 (two) times daily for 5 days. 10 tablet 0   No current facility-administered medications for this encounter.    Allergies  Allergen Reactions   Wound Dressing Adhesive     Redness, skin irritation   Sudafed [Pseudoephedrine] Other (See Comments)    jitters    Social History   Socioeconomic History   Marital status: Married    Spouse name: Not on file   Number of children: 4   Years of education: Not on file   Highest education level: Not on file  Occupational History   Not on file  Tobacco Use   Smoking status: Never    Passive exposure: Past   Smokeless tobacco: Never   Tobacco comments:    Never smoke 04/10/22  Vaping Use   Vaping Use: Never used  Substance and Sexual Activity   Alcohol use: Yes    Alcohol/week: 3.0 - 5.0 standard drinks of alcohol    Types: 3 - 5 Cans of beer per week    Comment: 3-5 beers a week 04/10/22   Drug use: No   Sexual activity: Yes  Other Topics Concern   Not on file  Social History Narrative   Not on file   Social Determinants of Health   Financial Resource Strain: Not on file  Food Insecurity: Not on file  Transportation Needs: Not on file  Physical Activity: Not on file  Stress: Not on file  Social Connections: Not on file  Intimate Partner Violence: Not on file     ROS- All systems are reviewed and negative except as per the HPI above.  Physical Exam: Vitals:   04/10/22 1100  BP: (!) 132/90  Pulse: (!) 53  Weight: 86.4 kg  Height: '5\' 10"'$  (1.778 m)      GEN- The patient is a well appearing male, alert and oriented x 3 today.   HEENT-head normocephalic, atraumatic, sclera clear, conjunctiva pink, hearing intact, trachea midline. Lungs- Clear to ausculation bilaterally, normal work of breathing Heart- Regular rate and rhythm, no murmurs, rubs or gallops  GI- soft, NT, ND, + BS Extremities- no clubbing, cyanosis, or edema MS- no significant deformity or atrophy Skin- no rash or lesion Psych- euthymic mood, full affect Neuro- strength and sensation are intact   Wt Readings from Last  3 Encounters:  04/10/22 86.4 kg  03/14/22 85.3 kg  03/12/22 85.3 kg    EKG today demonstrates  SB Vent. rate 53 BPM PR interval 162 ms QRS duration 90 ms QT/QTcB 420/394 ms  Echo 02/24/21 demonstrated   1. Left ventricular ejection fraction, by estimation, is 65 to 70%. The  left ventricle has normal function. The left ventricle has no regional  wall motion abnormalities. There is moderate left ventricular hypertrophy. Left ventricular diastolic parameters are consistent with Grade I diastolic dysfunction (impaired relaxation).   2. Right ventricular systolic function is normal. The right ventricular  size is normal. Tricuspid regurgitation signal is inadequate for assessing PA pressure.   3. The mitral valve is abnormal. trivial to mild mitral valve  regurgitation.   4. The aortic valve is tricuspid. Aortic valve regurgitation is mild.  Mild aortic valve sclerosis is present, with no evidence of aortic valve  stenosis.   5. The inferior vena cava is normal in size with greater than 50%  respiratory variability, suggesting right atrial pressure of 3 mmHg.   Epic records are reviewed at length today  CHA2DS2-VASc Score = 1  The patient's score is based upon: CHF History: 0 HTN History: 0 Diabetes History: 0 Stroke History: 0 Vascular Disease History: 0 Age Score: 1 Gender Score: 0       ASSESSMENT AND PLAN: 1. Paroxysmal Atrial  Fibrillation (ICD10:  I48.0) The patient's CHA2DS2-VASc score is 1, indicating a 0.6% annual risk of stroke.   S/p afib ablation 03/14/22 Patient appears to be maintaining SR. Continue Eliquis 5 mg BID with no missed doses for 3 months post ablation.  Continue Toprol 12.5 mg daily   2. Obstructive sleep apnea Encouraged compliance with CPAP therapy.    Follow up with Dr Quentin Ore for 3 month post ablation visit.    [facility] [address] [place], [place] [address] [phone] 04/10/2022 11:25 AM

## 2022-04-12 ENCOUNTER — Encounter: Payer: Self-pay | Admitting: Cardiology

## 2022-04-16 ENCOUNTER — Telehealth: Payer: Self-pay | Admitting: Cardiology

## 2022-04-16 NOTE — Telephone Encounter (Signed)
Advised the patient per Dr. Quentin Ore to restart the Protonix and finish what he has left. Verbalized understanding.

## 2022-04-16 NOTE — Telephone Encounter (Signed)
Pt c/o medication issue:  1. Name of Medication: pantoprazole (PROTONIX) 40 MG tablet  2. How are you currently taking this medication (dosage and times per day)?   3. Are you having a reaction (difficulty breathing--STAT)?   4. What is your medication issue? Pt states that he has forgotten to take this medication for the past 19 days post ablation. Requesting to speak to [place], Therapist, sports.

## 2022-04-16 NOTE — Telephone Encounter (Signed)
See phone encounter.

## 2022-05-05 ENCOUNTER — Ambulatory Visit: Payer: 59 | Admitting: Cardiology

## 2022-06-18 ENCOUNTER — Ambulatory Visit: Payer: 59 | Attending: Cardiology | Admitting: Cardiology

## 2022-06-18 ENCOUNTER — Encounter: Payer: Self-pay | Admitting: Cardiology

## 2022-06-18 VITALS — BP 126/68 | HR 60 | Ht 70.5 in | Wt 194.0 lb

## 2022-06-18 DIAGNOSIS — I480 Paroxysmal atrial fibrillation: Secondary | ICD-10-CM | POA: Diagnosis not present

## 2022-06-18 DIAGNOSIS — Z789 Other specified health status: Secondary | ICD-10-CM

## 2022-06-18 NOTE — Patient Instructions (Signed)
Medication Instructions:  None  *If you need a refill on your cardiac medications before your next appointment, please call your pharmacy*   Lab Work: None  If you have labs (blood work) drawn today and your tests are completely normal, you will receive your results only by: [place] (if you have MyChart) OR A paper copy in the mail If you have any lab test that is abnormal or we need to change your treatment, we will call you to review the results.   Testing/Procedures: None    Follow-Up: At [facility], you and your health needs are our priority.  As part of our continuing mission to provide you with exceptional heart care, we have created designated Provider Care Teams.  These Care Teams include your primary Cardiologist (physician) and Advanced Practice Providers (APPs -  Physician Assistants and Nurse Practitioners) who all work together to provide you with the care you need, when you need it.  We recommend signing up for the patient portal called "MyChart".  Sign up information is provided on this After Visit Summary.  MyChart is used to connect with patients for Virtual Visits (Telemedicine).  Patients are able to view lab/test results, encounter notes, upcoming appointments, etc.  Non-urgent messages can be sent to your provider as well.   To learn more about what you can do with MyChart, go to [email or web address].    Your next appointment:   1 year(s)  The format for your next appointment:   In Person  Provider:   You will see one of the following Advanced Practice Providers on your designated Care Team:   Tommye Standard, [place] Legrand Como "Jonni Sanger" Chalmers Cater, [place]       Important Information About Sugar

## 2022-06-18 NOTE — Progress Notes (Signed)
Electrophysiology Office Follow up Visit Note:    Date:  06/18/2022   ID:  Dylan Hale, DOB 17-Nov-1950, MRN [id number]  PCP:  Bayard Beaver., MD  [facility] Cardiologist:  Kirk Ruths, MD  [facility] Electrophysiologist:  Vickie Epley, MD    Interval History:    Dylan Hale is a 71 y.o. male who presents for a follow up visit. They were last seen in clinic April 23, 2021 for paroxysmal atrial fibrillation.  At that visit he had a low burden but highly symptomatic paroxysms of atrial fibrillation. He is on Eliquis for stroke prophylaxis.  At that appointment he wished to pursue a conservative treatment strategy and avoid invasive procedures so we plan to see him back in 6 months.  He is on metoprolol but this is because sinus bradycardia with rates in the low 40s.  He was seen via video visit 03/12/22, where he indicated interest in pursuing ablation and watchman implant.   Today:  He is feeling well overall.  He feels significant fatigue with metoprolol. He hopes to change to PRN.  His groin site has healed well.  Most days he doesn't drink, but on special occasions he will have 3-4 beers.  He denies any chest pain, shortness of breath, or peripheral edema. No lightheadedness, headaches, syncope, orthopnea, or PND.     Past Medical History:  Diagnosis Date   Anxiety    Chest pain    Depression    Enlarged prostate    Hyperlipidemia    Hypogonadism male    Hypothyroidism    Plantar fibromatosis    right foot    Past Surgical History:  Procedure Laterality Date   ATRIAL FIBRILLATION ABLATION N/A 03/14/2022   Procedure: ATRIAL FIBRILLATION ABLATION;  Surgeon: Vickie Epley, MD;  Location: [facility];  Service: Cardiovascular;  Laterality: N/A;   MASS EXCISION Right 08/02/2015   Procedure: EXCISION OF RIGHT FOOT MASS;  Surgeon: Wylene Simmer, MD;  Location: [facility];  Service: Orthopedics;  Laterality: Right;    SHOULDER SURGERY Left    Arthroscopic    Current Medications: Current Meds  Medication Sig   acetaminophen (TYLENOL) 500 MG tablet Take 500 mg by mouth every 6 (six) hours as needed for moderate pain.   apixaban (ELIQUIS) 5 MG TABS tablet Take 1 tablet (5 mg total) by mouth 2 (two) times daily.   aspirin EC 81 MG tablet Take 81 mg by mouth daily.   Ca Carbonate-Mag Hydroxide (ROLAIDS PO) Take 2 tablets by mouth daily as needed (heartburn).   cetirizine (ZYRTEC) 10 MG tablet Take 10 mg by mouth daily.   Cholecalciferol (VITAMIN D) 50 MCG (2000 UT) tablet Take 2,000 Units by mouth daily.   clonazePAM (KLONOPIN) 0.5 MG tablet Take 0.25 mg by mouth 2 (two) times daily.   Coenzyme Q10 200 MG capsule Take 200 mg by mouth every other day. With the rosuvastatin   finasteride (PROSCAR) 5 MG tablet Take 5 mg by mouth daily.   levothyroxine (SYNTHROID, LEVOTHROID) 75 MCG tablet Take 75 mcg by mouth daily.   metoprolol succinate (TOPROL XL) 25 MG 24 hr tablet Take 12.5 mg (one-half tablet) each night.   Omega-3 Fatty Acids (FISH OIL) 1200 MG CAPS Take 1,200 mg by mouth daily.   PARoxetine (PAXIL) 20 MG tablet Take 20 mg by mouth daily.   rosuvastatin (CRESTOR) 10 MG tablet Take 10 mg by mouth every other day.   tamsulosin (FLOMAX) 0.4 MG CAPS capsule  Take 0.4 mg by mouth daily.   Testosterone 20.25 MG/ACT (1.62%) GEL Apply 1-2 Pump topically every morning. On each shoulder     Allergies:   Sudafed [pseudoephedrine]   Social History   Socioeconomic History   Marital status: Married    Spouse name: Not on file   Number of children: 4   Years of education: Not on file   Highest education level: Not on file  Occupational History   Not on file  Tobacco Use   Smoking status: Never    Passive exposure: Past   Smokeless tobacco: Never   Tobacco comments:    Never smoke 04/10/22  Vaping Use   Vaping Use: Never used  Substance and Sexual Activity   Alcohol use: Yes    Alcohol/week: 3.0 - 5.0  standard drinks of alcohol    Types: 3 - 5 Cans of beer per week    Comment: 3-5 beers a week 04/10/22   Drug use: No   Sexual activity: Yes  Other Topics Concern   Not on file  Social History Narrative   Not on file   Social Determinants of Health   Financial Resource Strain: Not on file  Food Insecurity: Not on file  Transportation Needs: Not on file  Physical Activity: Not on file  Stress: Not on file  Social Connections: Not on file     Family History: The patient's family history includes Anxiety disorder in his mother; Autoimmune disease in his mother; Colon cancer in his maternal grandmother; Emphysema in his maternal grandfather; Lung cancer in his paternal grandfather; Prostate cancer in his father.  ROS:   Please see the history of present illness.     No pertinent symptoms mentioned.  All other systems reviewed and are negative.  EKGs/Labs/Other Studies Reviewed:    The following studies were reviewed today:  EKG:    06/18/22: NSR  The ekg ordered today demonstrates sinus rhythm with a ventricular rate of 60 bpm.  Recent Labs: 02/20/2022: BUN 15; Creatinine, Ser 1.13; Hemoglobin 16.1; Platelets 191; Potassium 5.0; Sodium 140  Recent Lipid Panel No results found for: "CHOL", "TRIG", "HDL", "CHOLHDL", "VLDL", "LDLCALC", "LDLDIRECT"  Physical Exam:    VS:  BP 126/68   Pulse 60   Ht 5' 10.5" (1.791 m)   Wt 194 lb (88 kg)   SpO2 95%   BMI 27.44 kg/m     Wt Readings from Last 3 Encounters:  06/18/22 194 lb (88 kg)  04/10/22 190 lb 6.4 oz (86.4 kg)  03/14/22 188 lb (85.3 kg)     GEN:  Well nourished, well developed in no acute distress HEENT: Normal NECK: No JVD; No carotid bruits LYMPHATICS: No lymphadenopathy CARDIAC: RRR, no murmurs, rubs, gallops RESPIRATORY:  Clear to auscultation without rales, wheezing or rhonchi  ABDOMEN: Soft, non-tender, non-distended MUSCULOSKELETAL:  No edema; No deformity  SKIN: Warm and dry NEUROLOGIC:  Alert and  oriented x 3 PSYCHIATRIC:  Normal affect        ASSESSMENT:    1. Paroxysmal atrial fibrillation (HCC)     PLAN:    In order of problems listed above:  #Paroxysmal atrial fibrillation Doing great after his ablation 3 months ago.  Can change his metoprolol to as needed.  Continue Eliquis for stroke prophylaxis.  #Alcohol use Encouraged moderation in his alcohol and discussed the link between excessive alcohol use and atrial arrhythmias during today's clinic appointment.   Follow up in 1 year  Total time spent with patient today  45 minutes. This includes reviewing records, evaluating the patient and coordinating care.   Medication Adjustments/Labs and Tests Ordered: Current medicines are reviewed at length with the patient today.  Concerns regarding medicines are outlined above.  Orders Placed This Encounter  Procedures   EKG 12-Lead   No orders of the defined types were placed in this encounter.   I,Mary Mosetta Pigeon Buren,acting as a scribe for Vickie Epley, MD.,have documented all relevant documentation on the behalf of Vickie Epley, MD,as directed by  Vickie Epley, MD while in the presence of Vickie Epley, MD.   I, Vickie Epley, MD, have reviewed all documentation for this visit. The documentation on 06/18/22 for the exam, diagnosis, procedures, and orders are all accurate and complete.  Signed, Lars Mage, MD, [facility], [facility] 06/18/2022 9:49 PM    Electrophysiology [facility]

## 2022-08-06 ENCOUNTER — Other Ambulatory Visit: Payer: Self-pay | Admitting: Student

## 2022-12-17 ENCOUNTER — Emergency Department (HOSPITAL_BASED_OUTPATIENT_CLINIC_OR_DEPARTMENT_OTHER)
Admission: EM | Admit: 2022-12-17 | Discharge: 2022-12-17 | Disposition: A | Discharge status: Home / Self Care | Payer: 59 | Attending: Emergency Medicine | Admitting: Emergency Medicine

## 2022-12-17 ENCOUNTER — Encounter (HOSPITAL_BASED_OUTPATIENT_CLINIC_OR_DEPARTMENT_OTHER): Payer: Self-pay

## 2022-12-17 ENCOUNTER — Emergency Department (HOSPITAL_BASED_OUTPATIENT_CLINIC_OR_DEPARTMENT_OTHER): Payer: 59 | Admitting: Radiology

## 2022-12-17 ENCOUNTER — Other Ambulatory Visit: Payer: Self-pay

## 2022-12-17 ENCOUNTER — Emergency Department (HOSPITAL_BASED_OUTPATIENT_CLINIC_OR_DEPARTMENT_OTHER): Payer: 59

## 2022-12-17 DIAGNOSIS — Z7901 Long term (current) use of anticoagulants: Secondary | ICD-10-CM | POA: Diagnosis not present

## 2022-12-17 DIAGNOSIS — S93409A Sprain of unspecified ligament of unspecified ankle, initial encounter: Secondary | ICD-10-CM

## 2022-12-17 DIAGNOSIS — E039 Hypothyroidism, unspecified: Secondary | ICD-10-CM | POA: Diagnosis not present

## 2022-12-17 DIAGNOSIS — I2510 Atherosclerotic heart disease of native coronary artery without angina pectoris: Secondary | ICD-10-CM | POA: Insufficient documentation

## 2022-12-17 DIAGNOSIS — M25571 Pain in right ankle and joints of right foot: Secondary | ICD-10-CM | POA: Diagnosis present

## 2022-12-17 DIAGNOSIS — W108XXA Fall (on) (from) other stairs and steps, initial encounter: Secondary | ICD-10-CM | POA: Diagnosis not present

## 2022-12-17 DIAGNOSIS — S93401A Sprain of unspecified ligament of right ankle, initial encounter: Secondary | ICD-10-CM | POA: Diagnosis not present

## 2022-12-17 NOTE — ED Notes (Signed)
Patient verbalizes understanding of discharge instructions. Opportunity for questioning and answers were provided. Patient discharged from ED.  °

## 2022-12-17 NOTE — ED Notes (Signed)
Patient transported to CT 

## 2022-12-17 NOTE — Discharge Instructions (Signed)
You were seen in the emergency department after your fall.  You had no broken bones or injury to your brain on your x-rays and CT imaging.  You did likely sprained both of your ankles.  You should continue to ice your ankles and keep them elevated when you are at rest to help with the swelling and you can use the Ace wrap's to help with support as well as the swelling.  You can take Tylenol as needed up to every 6 hours for pain.  You can use the walking boot and the crutches as needed for help with walking though you can put weight on your ankles as you can tolerate.  You should follow-up with your primary doctor or with orthopedics to have your symptoms rechecked.  You should return to the emergency department if you have numbness in your feet or toes turning blue, you have new falls or injuries or if you have any other new or concerning symptoms.

## 2022-12-17 NOTE — ED Triage Notes (Signed)
Patient here POV from Home.  Endorses Fall 1 Hour ago when he missed a sep going down stairs and fell 2 steps. Pain to Bilateral Ankles. Swelling to Same. No Head Injury or LOC. Takes Eliquis.   NAD Noted during Triage. A&Ox4. GCS 15. BIB Wheelchair.

## 2022-12-17 NOTE — ED Provider Notes (Signed)
D[facility] Hale EMERGENCY DEPARTMENT AT [facility] Provider Note   CSN: [id number] Arrival date & time: 12/17/22  1219     History  Chief Complaint  Patient presents with   Dylan Hale is a 72 y.o. male.  Patient is a 72 year old male with a past medical history of A-fib on Eliquis, BPH, hypothyroidism presenting to the emergency department after a fall.  The patient states that he was walking down the steps when he slipped and missed the last few steps causing him to fall.  He states that he initially felt his right ankle twist and heard a pop and then when he tried to balance himself on the left his left ankle twisted and he heard another pop.  He does not think that he hit his head and denies loss of consciousness.  He states that he had to lie on the ground for several minutes before he was able to get himself up.  He states he was able to bear weight on his left foot but had difficulty bearing weight on the right.  He states that he had some tingling in his right toes but denies any numbness or weakness.  He denies any headaches, nausea or vomiting since the fall and denies any chest pain, shortness of breath or lightheadedness prior to the fall.  The history is provided by the patient and the spouse.  Fall       Home Medications Prior to Admission medications   Medication Sig Start Date End Date Taking? Authorizing Provider  acetaminophen (TYLENOL) 500 MG tablet Take 500 mg by mouth every 6 (six) hours as needed for moderate pain.    [provider]  apixaban (ELIQUIS) 5 MG TABS tablet Take 1 tablet (5 mg total) by mouth 2 (two) times daily. 03/20/21   Lewayne Bunting, MD  aspirin EC 81 MG tablet Take 81 mg by mouth daily.    [provider]  Ca Carbonate-Mag Hydroxide (ROLAIDS PO) Take 2 tablets by mouth daily as needed (heartburn).    [provider]  cetirizine (ZYRTEC) 10 MG tablet Take 10 mg by mouth daily. 11/08/21   [provider]  Cholecalciferol (VITAMIN D) 50 MCG (2000 UT) tablet Take 2,000 Units by mouth daily.    [provider]  clonazePAM (KLONOPIN) 0.5 MG tablet Take 0.25 mg by mouth 2 (two) times daily. 02/07/21   [provider]  Coenzyme Q10 200 MG capsule Take 200 mg by mouth every other day. With the rosuvastatin    [provider]  finasteride (PROSCAR) 5 MG tablet Take 5 mg by mouth daily.    [provider]  levothyroxine (SYNTHROID, LEVOTHROID) 75 MCG tablet Take 75 mcg by mouth daily. 03/18/13   [provider]  metoprolol succinate (TOPROL-XL) 25 MG 24 hr tablet TAKE ONE-HALF TABLET EACH NIGHT. 08/06/22   Lanier Prude, MD  Omega-3 Fatty Acids (FISH OIL) 1200 MG CAPS Take 1,200 mg by mouth daily.    [provider]  PARoxetine (PAXIL) 20 MG tablet Take 20 mg by mouth daily.    [provider]  rosuvastatin (CRESTOR) 10 MG tablet Take 10 mg by mouth every other day. 11/03/21   [provider]  tamsulosin (FLOMAX) 0.4 MG CAPS capsule Take 0.4 mg by mouth daily. 02/17/21   [provider]  Testosterone 20.25 MG/ACT (1.62%) GEL Apply 1-2 Pump topically every morning. On each shoulder 12/10/20   [provider]  Allergies    Sudafed [pseudoephedrine]    Review of Systems   Review of Systems  Physical Exam Updated Vital Signs BP 127/86   Pulse (!) 54   Temp (!) 96.5 F (35.8 C) (Temporal)   Resp 18   Ht 5\' 11"  (1.803 m)   Wt 83.9 kg   SpO2 97%   BMI 25.80 kg/m  Physical Exam Vitals and nursing note reviewed.  Constitutional:      General: He is not in acute distress.    Appearance: Normal appearance.  HENT:     Head: Normocephalic and atraumatic.     Nose: Nose normal.     Mouth/Throat:     Mouth: Mucous membranes are moist.     Pharynx: Oropharynx is clear.  Eyes:     Extraocular Movements: Extraocular movements intact.     Conjunctiva/sclera: Conjunctivae normal.     Pupils:  Pupils are equal, round, and reactive to light.  Neck:     Comments: No midline neck tenderness Cardiovascular:     Rate and Rhythm: Normal rate and regular rhythm.     Pulses: Normal pulses.     Heart sounds: Normal heart sounds.  Pulmonary:     Effort: Pulmonary effort is normal.     Breath sounds: Normal breath sounds.  Abdominal:     General: Abdomen is flat.     Palpations: Abdomen is soft.     Tenderness: There is no abdominal tenderness.  Musculoskeletal:        General: Normal range of motion.     Cervical back: Normal range of motion and neck supple.     Comments: No midline back tenderness No bony tenderness to bilateral upper extremities Pelvis stable, nontender Tenderness and swelling to bilateral lateral malleoli  Skin:    General: Skin is warm and dry.  Neurological:     General: No focal deficit present.     Mental Status: He is alert and oriented to person, place, and time.     Sensory: No sensory deficit.     Motor: No weakness.  Psychiatric:        Mood and Affect: Mood normal.        Behavior: Behavior normal.     ED Results / Procedures / Treatments   Labs (all labs ordered are listed, but only abnormal results are displayed) Labs Reviewed - No data to display  EKG None  Radiology CT Head Wo Contrast  Result Date: 12/17/2022 CLINICAL DATA:  Fall EXAM: CT HEAD WITHOUT CONTRAST CT CERVICAL SPINE WITHOUT CONTRAST TECHNIQUE: Multidetector CT imaging of the head and cervical spine was performed following the standard protocol without intravenous contrast. Multiplanar CT image reconstructions of the cervical spine were also generated. RADIATION DOSE REDUCTION: This exam was performed according to the departmental dose-optimization program which includes automated exposure control, adjustment of the mA and/or kV according to patient size and/or use of iterative reconstruction technique. COMPARISON:  CT head and cervical spine 02/24/2021 FINDINGS: CT HEAD  FINDINGS Brain: There is no acute intracranial hemorrhage, extra-axial fluid collection, or acute infarct Parenchymal volume is normal. The ventricles are normal in size. Gray-white differentiation is preserved. 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 No cortical encephalomalacia is seen. The pituitary and suprasellar region are normal. There is no mass lesion. There is no mass effect or midline shift. Vascular: No hyperdense vessel or unexpected calcification. Skull: Normal. Negative for fracture or focal lesion. Sinuses/Orbits: There is mucosal thickening  in the ethmoid air cells and layering fluid in the left maxillary sinus. The globes and orbits are unremarkable. Other: None. CT CERVICAL SPINE FINDINGS Alignment: There is no antero or retrolisthesis. There is no jumped or perched facet or other evidence of traumatic malalignment. Skull base and vertebrae: Skull base alignment is maintained. Vertebral body heights are preserved. There is no evidence of acute fracture. There is no suspicious osseous lesion. Soft tissues and spinal canal: No prevertebral fluid or swelling. No visible canal hematoma. Disc levels: There is disc space narrowing and degenerative endplate change C4-C5 through C6-C7. There is no evidence of high-grade spinal canal stenosis. There is overall mild multilevel facet arthropathy. Upper chest: The minimally imaged lung apices are clear. Other: There is 1.6 cm right thyroid nodule, previously evaluated by ultrasound. IMPRESSION: 1. No acute intracranial pathology. 2. No acute fracture or traumatic malalignment of the cervical spine. 3. Layering fluid in the left maxillary sinus which can be seen with acute sinusitis in the correct clinical setting. Electronically Signed   By: Lesia Hausen M.D.   On: 12/17/2022 14:13   CT Cervical Spine Wo Contrast  Result Date: 12/17/2022 CLINICAL DATA:  Fall EXAM: CT HEAD WITHOUT CONTRAST CT CERVICAL SPINE WITHOUT  CONTRAST TECHNIQUE: Multidetector CT imaging of the head and cervical spine was performed following the standard protocol without intravenous contrast. Multiplanar CT image reconstructions of the cervical spine were also generated. RADIATION DOSE REDUCTION: This exam was performed according to the departmental dose-optimization program which includes automated exposure control, adjustment of the mA and/or kV according to patient size and/or use of iterative reconstruction technique. COMPARISON:  CT head and cervical spine 02/24/2021 FINDINGS: CT HEAD FINDINGS Brain: There is no acute intracranial hemorrhage, extra-axial fluid collection, or acute infarct Parenchymal volume is normal. The ventricles are normal in size. Gray-white differentiation is preserved. 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 No cortical encephalomalacia is seen. The pituitary and suprasellar region are normal. There is no mass lesion. There is no mass effect or midline shift. Vascular: No hyperdense vessel or unexpected calcification. Skull: Normal. Negative for fracture or focal lesion. Sinuses/Orbits: There is mucosal thickening in the ethmoid air cells and layering fluid in the left maxillary sinus. The globes and orbits are unremarkable. Other: None. CT CERVICAL SPINE FINDINGS Alignment: There is no antero or retrolisthesis. There is no jumped or perched facet or other evidence of traumatic malalignment. Skull base and vertebrae: Skull base alignment is maintained. Vertebral body heights are preserved. There is no evidence of acute fracture. There is no suspicious osseous lesion. Soft tissues and spinal canal: No prevertebral fluid or swelling. No visible canal hematoma. Disc levels: There is disc space narrowing and degenerative endplate change C4-C5 through C6-C7. There is no evidence of high-grade spinal canal stenosis. There is overall mild multilevel facet arthropathy. Upper chest: The minimally  imaged lung apices are clear. Other: There is 1.6 cm right thyroid nodule, previously evaluated by ultrasound. IMPRESSION: 1. No acute intracranial pathology. 2. No acute fracture or traumatic malalignment of the cervical spine. 3. Layering fluid in the left maxillary sinus which can be seen with acute sinusitis in the correct clinical setting. Electronically Signed   By: Lesia Hausen M.D.   On: 12/17/2022 14:13   DG Ankle Complete Right  Result Date: 12/17/2022 CLINICAL DATA:  Bilateral ankle pain after fall down stairs. EXAM: RIGHT ANKLE - COMPLETE 3+ VIEW COMPARISON:  None Available. FINDINGS: There is no acute fracture  or dislocation. Ankle alignment is maintained. The ankle mortise is intact. The joint spaces are preserved. There is no erosive change. There is mild soft tissue swelling over the lateral malleolus. IMPRESSION: Soft tissue swelling over the lateral malleolus with no underlying fracture or dislocation. Electronically Signed   By: Lesia Hausen M.D.   On: 12/17/2022 13:07   DG Ankle Complete Left  Result Date: 12/17/2022 CLINICAL DATA:  Fall.  Bilateral ankle pain. EXAM: LEFT ANKLE COMPLETE - 3+ VIEW COMPARISON:  None Available. FINDINGS: There is no evidence of fracture, dislocation, or joint effusion. There is no evidence of arthropathy or other focal bone abnormality. Lateral soft tissue edema. IMPRESSION: 1. No acute bone abnormality. 2. Lateral soft tissue edema. Electronically Signed   By: Signa Kell M.D.   On: 12/17/2022 13:05    Procedures Procedures    Medications Ordered in ED Medications - No data to display  ED Course/ Medical Decision Making/ A&P Clinical Course as of 12/17/22 1441  Wed Dec 17, 2022  1426 No acute traumatic injury on CT imaging. Ankle XR's negative for fracture or dislocation. Likely has bilateral ankle sprain. [VK]    Clinical Course User Index [VK] Rexford Maus, DO                             Medical Decision Making This patient  presents to the ED with chief complaint(s) of fall with pertinent past medical history of A-fib on Eliquis, hypothyroidism, BPH which further complicates the presenting complaint. The complaint involves an extensive differential diagnosis and also carries with it a high risk of complications and morbidity.    The differential diagnosis includes due to patient's fall on thinners, concern for ICH, mass effect, cervical spine injury, ankle fracture, dislocation or sprain, no other traumatic injury seen on exam, no presyncopal symptoms making syncopal fall unlikely  Additional history obtained: Additional history obtained from spouse Records reviewed N/A  ED Course and Reassessment: On patient's arrival to the emergency department he is well-appearing in no acute distress.  Due to patient's fall on thinners he will have CT head and C-spine performed.  He will additionally have bilateral x-rays performed with his ankle pain and swelling on exam.  He will be given ice packs and declined additional pain medication at this time.  Independent labs interpretation:  N/A  Independent visualization of imaging: - I independently visualized the following imaging with scope of interpretation limited to determining acute life threatening conditions related to emergency care: CTH/C-spine, bilateral ankle XR, which revealed no acute traumatic injury  Consultation: - Consulted or discussed management/test interpretation w/ external professional: N/A  Consideration for admission or further workup: Patient has no emergent conditions requiring admission or further work-up at this time and is stable for discharge home with primary care and ortho follow-up  Social Determinants of health: N/A    Amount and/or Complexity of Data Reviewed Radiology: ordered.          Final Clinical Impression(s) / ED Diagnoses Final diagnoses:  Fall down steps, initial encounter  Sprain of ankle, unspecified laterality,  unspecified ligament, initial encounter    Rx / DC Orders ED Discharge Orders     None         Rexford Maus, DO 12/17/22 1441

## 2022-12-26 ENCOUNTER — Telehealth: Payer: Self-pay | Admitting: *Deleted

## 2022-12-26 NOTE — Telephone Encounter (Signed)
     Primary Cardiologist: Olga Millers, MD  Chart reviewed as part of pre-operative protocol coverage. Given past medical history and time since last visit, based on ACC/AHA guidelines, Jassim Haymon would be at acceptable risk for the planned procedure without further cardiovascular testing.   His RCRI is a class II risk, 0.9% risk of major cardiac event.  He is able to complete greater than 4 METS of physical activity.  Patient was advised that if he develops new symptoms prior to surgery to contact our office to arrange a follow-up appointment.  He verbalized understanding.  Procedure: OPEN TREATMENT RIGHT ANKLE SYNDESMOSIS SPRAIN WITH INTERNAL FIXATION; POSSIBLE DELTOID LIGAMENT REPAIR  Date of procedure: 12/30/22     CHA2DS2-VASc Score = 1   This indicates a 0.6% annual risk of stroke. The patient's score is based upon: CHF History: 0 HTN History: 0 Diabetes History: 0 Stroke History: 0 Vascular Disease History: 0 Age Score: 1 Gender Score: 0        CrCl 63 ml/min   Per office protocol, patient can hold Eliquis for 3 days prior to procedure.    I will route this recommendation to the requesting party via Epic fax function and remove from pre-op pool.  Please call with questions.  Thomasene Ripple. Aryon Nham NP-C     12/26/2022, 4:18 PM [facility] [address] Office [phone] Fax [phone]

## 2022-12-26 NOTE — Telephone Encounter (Signed)
   Pre-operative Risk Assessment    Patient Name: Dylan Hale  DOB: Jul 12, 1951 MRN: [id number]      Request for Surgical Clearance    Procedure:   OPEN TREATMENT RIGHT ANKLE SYNDESMOSIS SPRAIN WITH INTERNAL FIXATION; POSSIBLE DELTOID LIGAMENT REPAIR  Date of Surgery:  Clearance 12/30/22                                 Surgeon:  DR. Jonny Ruiz HEWITT Surgeon's Group or Practice Name:  [facility] Phone number:  [phone] ATTN: MEGAN DAVIS Fax number:  [phone]   Type of Clearance Requested:   - Medical  - Pharmacy:  Hold Aspirin and Apixaban (Eliquis)     Type of Anesthesia:  General  AND REGIONAL ANESTHESIA    Additional requests/questions:    Dylan Hale   12/26/2022, 1:23 PM

## 2022-12-26 NOTE — Telephone Encounter (Signed)
Patient with diagnosis of afib on Eliquis for anticoagulation.    Procedure: OPEN TREATMENT RIGHT ANKLE SYNDESMOSIS SPRAIN WITH INTERNAL FIXATION; POSSIBLE DELTOID LIGAMENT REPAIR  Date of procedure: 12/30/22   CHA2DS2-VASc Score = 1   This indicates a 0.6% annual risk of stroke. The patient's score is based upon: CHF History: 0 HTN History: 0 Diabetes History: 0 Stroke History: 0 Vascular Disease History: 0 Age Score: 1 Gender Score: 0       CrCl 63 ml/min  Per office protocol, patient can hold Eliquis for 3 days prior to procedure.    **This guidance is not considered finalized until pre-operative APP has relayed final recommendations.**

## 2023-12-16 ENCOUNTER — Other Ambulatory Visit: Payer: Self-pay

## 2023-12-16 ENCOUNTER — Encounter (HOSPITAL_COMMUNITY): Payer: Self-pay

## 2023-12-16 ENCOUNTER — Emergency Department (HOSPITAL_COMMUNITY)
Admission: EM | Admit: 2023-12-16 | Discharge: 2023-12-16 | Disposition: A | Discharge status: Home / Self Care | Attending: Emergency Medicine | Admitting: Emergency Medicine

## 2023-12-16 ENCOUNTER — Emergency Department (HOSPITAL_COMMUNITY)

## 2023-12-16 DIAGNOSIS — R519 Headache, unspecified: Secondary | ICD-10-CM | POA: Diagnosis present

## 2023-12-16 DIAGNOSIS — R413 Other amnesia: Secondary | ICD-10-CM | POA: Diagnosis not present

## 2023-12-16 DIAGNOSIS — G43109 Migraine with aura, not intractable, without status migrainosus: Secondary | ICD-10-CM | POA: Insufficient documentation

## 2023-12-16 DIAGNOSIS — Z7982 Long term (current) use of aspirin: Secondary | ICD-10-CM | POA: Diagnosis not present

## 2023-12-16 DIAGNOSIS — Z7901 Long term (current) use of anticoagulants: Secondary | ICD-10-CM | POA: Insufficient documentation

## 2023-12-16 LAB — CBC WITH DIFFERENTIAL/PLATELET
Abs Immature Granulocytes: 0.03 10*3/uL (ref 0.00–0.07)
Basophils Absolute: 0.1 10*3/uL (ref 0.0–0.1)
Basophils Relative: 1 %
Eosinophils Absolute: 0.4 10*3/uL (ref 0.0–0.5)
Eosinophils Relative: 8 %
HCT: 48.6 % (ref 39.0–52.0)
Hemoglobin: 16.5 g/dL (ref 13.0–17.0)
Immature Granulocytes: 1 %
Lymphocytes Relative: 31 %
Lymphs Abs: 1.4 10*3/uL (ref 0.7–4.0)
MCH: 33 pg (ref 26.0–34.0)
MCHC: 34 g/dL (ref 30.0–36.0)
MCV: 97.2 fL (ref 80.0–100.0)
Monocytes Absolute: 0.5 10*3/uL (ref 0.1–1.0)
Monocytes Relative: 10 %
Neutro Abs: 2.3 10*3/uL (ref 1.7–7.7)
Neutrophils Relative %: 49 %
Platelets: 208 10*3/uL (ref 150–400)
RBC: 5 MIL/uL (ref 4.22–5.81)
RDW: 12.1 % (ref 11.5–15.5)
WBC: 4.7 10*3/uL (ref 4.0–10.5)
nRBC: 0 % (ref 0.0–0.2)

## 2023-12-16 LAB — PROTIME-INR
INR: 1.2 (ref 0.8–1.2)
Prothrombin Time: 15.5 s — ABNORMAL HIGH (ref 11.4–15.2)

## 2023-12-16 LAB — I-STAT CHEM 8, ED
BUN: 21 mg/dL (ref 8–23)
Calcium, Ion: 1.2 mmol/L (ref 1.15–1.40)
Chloride: 104 mmol/L (ref 98–111)
Creatinine, Ser: 1.3 mg/dL — ABNORMAL HIGH (ref 0.61–1.24)
Glucose, Bld: 101 mg/dL — ABNORMAL HIGH (ref 70–99)
HCT: 47 % (ref 39.0–52.0)
Hemoglobin: 16 g/dL (ref 13.0–17.0)
Potassium: 4.9 mmol/L (ref 3.5–5.1)
Sodium: 139 mmol/L (ref 135–145)
TCO2: 26 mmol/L (ref 22–32)

## 2023-12-16 LAB — APTT: aPTT: 36 s (ref 24–36)

## 2023-12-16 LAB — COMPREHENSIVE METABOLIC PANEL WITH GFR
ALT: 19 U/L (ref 0–44)
AST: 20 U/L (ref 15–41)
Albumin: 4.2 g/dL (ref 3.5–5.0)
Alkaline Phosphatase: 56 U/L (ref 38–126)
Anion gap: 9 (ref 5–15)
BUN: 23 mg/dL (ref 8–23)
CO2: 27 mmol/L (ref 22–32)
Calcium: 9.4 mg/dL (ref 8.9–10.3)
Chloride: 106 mmol/L (ref 98–111)
Creatinine, Ser: 0.92 mg/dL (ref 0.61–1.24)
GFR, Estimated: >60 mL/min
Glucose, Bld: 104 mg/dL — ABNORMAL HIGH (ref 70–99)
Potassium: 4.9 mmol/L (ref 3.5–5.1)
Sodium: 142 mmol/L (ref 135–145)
Total Bilirubin: 0.8 mg/dL (ref 0.0–1.2)
Total Protein: 7 g/dL (ref 6.5–8.1)

## 2023-12-16 LAB — RAPID URINE DRUG SCREEN, HOSP PERFORMED
Amphetamines: NOT DETECTED
Barbiturates: NOT DETECTED
Benzodiazepines: NOT DETECTED
Cocaine: NOT DETECTED
Opiates: NOT DETECTED
Tetrahydrocannabinol: NOT DETECTED

## 2023-12-16 LAB — CBG MONITORING, ED: Glucose-Capillary: 94 mg/dL (ref 70–99)

## 2023-12-16 LAB — ETHANOL: Alcohol, Ethyl (B): <15 mg/dL

## 2023-12-16 NOTE — Discharge Instructions (Signed)
 You are seen in the emergency department for a migraine type headache but with new memory loss.  Your lab work and CAT scan did not show an obvious explanation for your symptoms.  Neurology is recommending an MRI but unfortunately MRI left for the day and you were unable to obtain it today.  I am putting in an MRI request for you for tomorrow.  Radiology should call you and give you a time.  I am also putting a referral in for you to see a neurologist.  Please continue your regular medications.

## 2023-12-16 NOTE — ED Notes (Addendum)
 Pts wife to window asking if pt can eat and I advised since blood work has not all came back yet to wait.

## 2023-12-16 NOTE — ED Notes (Signed)
 Pt ambulatory with steady gait to bathroom

## 2023-12-16 NOTE — ED Notes (Signed)
 Dr. Monnie Anthony and Dr. Carylon Claude notified of pt symptoms and MSE requested

## 2023-12-16 NOTE — ED Notes (Signed)
 Called lab and confirmed they received a purple top.

## 2023-12-16 NOTE — ED Provider Triage Note (Signed)
 Emergency Medicine Provider Triage Evaluation Note  Dylan Hale , a 73 y.o. male  was evaluated in triage.  Pt complains of headache and memory loss.  Start to have a "ocular migraine" around 10 AM this morning.  Also had a bout of memory loss where he could remember his wife's or son's name.  He states this lasted about 30 minutes.  He also felt that both legs were weak.  Also endorsed some blurry vision in both eyes.  He states the symptoms lasted for about 45 minutes and now have resolved aside from a persistent headache which is very similar to headaches she has had in the past.  He does report that he is under an immense amount of stress taking care of his elderly stepfather.  He does take Eliquis  for A-fib.  Denies chest pain, palpitations or shortness of breath.  Review of Systems  Positive: See above Negative: See above  Physical Exam  BP 127/89   Pulse (!) 47   Temp 98 F (36.7 C) (Oral)   Resp 16   Ht 5' 10.5" (1.791 m)   Wt 83.9 kg   SpO2 100%   BMI 26.17 kg/m  Gen:   Awake, no distress   Resp:  Normal effort  MSK:   Moves extremities without difficulty  Other:    Medical Decision Making  Medically screening exam initiated at 1:52 PM.  Appropriate orders placed.  Hassaan Crite was informed that the remainder of the evaluation will be completed by another provider, this initial triage assessment does not replace that evaluation, and the importance of remaining in the ED until their evaluation is complete.  Work up started   Janalee Mcmurray, PA-C 12/16/23 1353

## 2023-12-16 NOTE — ED Triage Notes (Signed)
 Pt playing on ipad at 1000 today and started developing ocular migraine and memory impairments. No speech impairment. Pt states both legs feel weak. All of symptoms resolved in an hour after onset. Pt still has headache.  Hx of ocular migraines, symptoms are the same as usual, except he had memory impairment and could not remember anything, including his wifes name and dogs name

## 2023-12-16 NOTE — ED Notes (Signed)
 Pt requesting time to bed for the third time.  I checked with triage RN and was told that he is next for a room.  I let pt know and he said he would stay a little longer but he wasn't waiting much longer.  Pt was advised to come back to desk if he decided to leave to let me know.

## 2023-12-16 NOTE — ED Provider Notes (Signed)
 [place] EMERGENCY DEPARTMENT AT [facility] Provider Note   CSN: [id number] Arrival date & time: 12/16/23  1310     History {Add pertinent medical, surgical, social history, OB history to HPI:1} Chief Complaint  Patient presents with   Headache    Dylan Hale is a 73 y.o. male.  He has a history of A-fib on Eliquis .  He tells me he also has ocular migraines which are a small area of visual loss followed by headache, vision symptoms last about 60 minutes and headache a few hours.  He usually gets it 2 or 3 times a year.  Has not seen a neurologist for it.  Had another episode today but it was accompanied by difficulty remembering wife's name.  Symptoms lasted about 60 minutes.  Everything resolved.  Has been compliant with his anticoagulation.  The history is provided by the patient.  Neurologic Problem This is a new problem. The current episode started 6 to 12 hours ago. The problem has been resolved. Associated symptoms include headaches. Pertinent negatives include no chest pain, no abdominal pain and no shortness of breath. Nothing aggravates the symptoms. He has tried nothing for the symptoms. The treatment provided significant relief.       Home Medications Prior to Admission medications   Medication Sig Start Date End Date Taking? Authorizing Provider  acetaminophen  (TYLENOL ) 500 MG tablet Take 500 mg by mouth every 6 (six) hours as needed for moderate pain.    [provider]  apixaban  (ELIQUIS ) 5 MG TABS tablet Take 1 tablet (5 mg total) by mouth 2 (two) times daily. 03/20/21   Lenise Quince, MD  aspirin EC 81 MG tablet Take 81 mg by mouth daily.    [provider]  Ca Carbonate-Mag Hydroxide (ROLAIDS PO) Take 2 tablets by mouth daily as needed (heartburn).    [provider]  cetirizine (ZYRTEC) 10 MG tablet Take 10 mg by mouth daily. 11/08/21   [provider]  Cholecalciferol (VITAMIN D) 50 MCG (2000 UT) tablet Take  2,000 Units by mouth daily.    [provider]  clonazePAM  (KLONOPIN ) 0.5 MG tablet Take 0.25 mg by mouth 2 (two) times daily. 02/07/21   [provider]  Coenzyme Q10 200 MG capsule Take 200 mg by mouth every other day. With the rosuvastatin    [provider]  finasteride (PROSCAR) 5 MG tablet Take 5 mg by mouth daily.    [provider]  levothyroxine  (SYNTHROID , LEVOTHROID) 75 MCG tablet Take 75 mcg by mouth daily. 03/18/13   [provider]  metoprolol  succinate (TOPROL -XL) 25 MG 24 hr tablet TAKE ONE-HALF TABLET EACH NIGHT. 08/06/22   Boyce Byes, MD  Omega-3 Fatty Acids (FISH OIL) 1200 MG CAPS Take 1,200 mg by mouth daily.    [provider]  PARoxetine  (PAXIL ) 20 MG tablet Take 20 mg by mouth daily.    [provider]  rosuvastatin (CRESTOR) 10 MG tablet Take 10 mg by mouth every other day. 11/03/21   [provider]  tamsulosin  (FLOMAX ) 0.4 MG CAPS capsule Take 0.4 mg by mouth daily. 02/17/21   [provider]  Testosterone 20.25 MG/ACT (1.62%) GEL Apply 1-2 Pump topically every morning. On each shoulder 12/10/20   [provider]      Allergies    Sudafed [pseudoephedrine]    Review of Systems   Review of Systems  Respiratory:  Negative for shortness of breath.   Cardiovascular:  Negative for chest  pain.  Gastrointestinal:  Negative for abdominal pain.  Neurological:  Positive for headaches.    Physical Exam Updated Vital Signs BP 135/78   Pulse (!) 51   Temp 97.6 F (36.4 C) (Oral)   Resp (!) 8   Ht 5' 10.5" (1.791 m)   Wt 83.9 kg   SpO2 99%   BMI 26.17 kg/m  Physical Exam Vitals and nursing note reviewed.  Constitutional:      General: He is not in acute distress.    Appearance: He is well-developed.  HENT:     Head: Normocephalic and atraumatic.  Eyes:     Conjunctiva/sclera: Conjunctivae normal.  Cardiovascular:     Rate and Rhythm: Normal rate and regular rhythm.      Heart sounds: No murmur heard. Pulmonary:     Effort: Pulmonary effort is normal. No respiratory distress.     Breath sounds: Normal breath sounds.  Abdominal:     Palpations: Abdomen is soft.     Tenderness: There is no abdominal tenderness.  Musculoskeletal:        General: No swelling.     Cervical back: Neck supple.  Skin:    General: Skin is warm and dry.     Capillary Refill: Capillary refill takes less than 2 seconds.  Neurological:     Mental Status: He is alert and oriented to person, place, and time.     GCS: GCS eye subscore is 4. GCS verbal subscore is 5. GCS motor subscore is 6.     Cranial Nerves: No cranial nerve deficit, dysarthria or facial asymmetry.     Sensory: No sensory deficit.     Motor: No weakness.     Gait: Gait normal.     ED Results / Procedures / Treatments   Labs (all labs ordered are listed, but only abnormal results are displayed) Labs Reviewed  PROTIME-INR - Abnormal; Notable for the following components:      Result Value   Prothrombin Time 15.5 (*)    All other components within normal limits  COMPREHENSIVE METABOLIC PANEL WITH GFR - Abnormal; Notable for the following components:   Glucose, Bld 104 (*)    All other components within normal limits  I-STAT CHEM 8, ED - Abnormal; Notable for the following components:   Creatinine, Ser 1.30 (*)    Glucose, Bld 101 (*)    All other components within normal limits  ETHANOL  APTT  CBC WITH DIFFERENTIAL/PLATELET  RAPID URINE DRUG SCREEN, HOSP PERFORMED  CBG MONITORING, ED    EKG None  Radiology CT HEAD WO CONTRAST Result Date: 12/16/2023 CLINICAL DATA:  Mental status change EXAM: CT HEAD WITHOUT CONTRAST TECHNIQUE: Contiguous axial images were obtained from the base of the skull through the vertex without intravenous contrast. RADIATION DOSE REDUCTION: This exam was performed according to the departmental dose-optimization program which includes automated exposure control, adjustment of  the mA and/or kV according to patient size and/or use of iterative reconstruction technique. COMPARISON:  Head CT 12/17/2022. FINDINGS: Brain: No evidence of acute infarction, hemorrhage, hydrocephalus, extra-axial collection or mass lesion/mass effect. Vascular: No hyperdense vessel or unexpected calcification. Skull: Normal. Negative for fracture or focal lesion. Sinuses/Orbits: There is diffuse mucosal thickening of the maxillary sinuses with an air-fluid level in the left maxillary sinus. Mastoid air cells are clear. Orbits are within normal limits. Other: None. IMPRESSION: 1. No acute intracranial process. 2. Acute left maxillary sinusitis.  But I Electronically Signed   By: Rollen Clines.D.  On: 12/16/2023 15:18    Procedures Procedures  {Document cardiac monitor, telemetry assessment procedure when appropriate:1}  Medications Ordered in ED Medications - No data to display  ED Course/ Medical Decision Making/ A&P   {   Click here for ABCD2, HEART and other calculatorsREFRESH Note before signing :1}                              Medical Decision Making  This patient complains of ***; this involves an extensive number of treatment Options and is a complaint that carries with it a high risk of complications and morbidity. The differential includes ***  I ordered, reviewed and interpreted labs, which included *** I ordered medication *** and reviewed PMP when indicated. I ordered imaging studies which included *** and I independently    visualized and interpreted imaging which showed *** Additional history obtained from *** Previous records obtained and reviewed *** I consulted *** and discussed lab and imaging findings and discussed disposition.  Cardiac monitoring reviewed, *** Social determinants considered, *** Critical Interventions: ***  After the interventions stated above, I reevaluated the patient and found *** Admission and further testing considered, ***   {Document  critical care time when appropriate:1} {Document review of labs and clinical decision tools ie heart score, Chads2Vasc2 etc:1}  {Document your independent review of radiology images, and any outside records:1} {Document your discussion with family members, caretakers, and with consultants:1} {Document social determinants of health affecting pt's care:1} {Document your decision making why or why not admission, treatments were needed:1} Final Clinical Impression(s) / ED Diagnoses Final diagnoses:  None    Rx / DC Orders ED Discharge Orders     None

## 2023-12-21 ENCOUNTER — Ambulatory Visit: Payer: Self-pay | Admitting: Neurology

## 2023-12-21 ENCOUNTER — Telehealth: Payer: Self-pay | Admitting: Neurology

## 2023-12-21 ENCOUNTER — Encounter: Payer: Self-pay | Admitting: Neurology

## 2023-12-21 VITALS — BP 126/81 | HR 69 | Ht 70.0 in | Wt 186.6 lb

## 2023-12-21 DIAGNOSIS — R4189 Other symptoms and signs involving cognitive functions and awareness: Secondary | ICD-10-CM

## 2023-12-21 DIAGNOSIS — H5319 Other subjective visual disturbances: Secondary | ICD-10-CM | POA: Insufficient documentation

## 2023-12-21 NOTE — Telephone Encounter (Signed)
 Needs MRI expedited per dr Gracie Lav. Is leaving the country 01/04/2024

## 2023-12-21 NOTE — Progress Notes (Signed)
 Chief Complaint  Patient presents with   New Patient (Initial Visit)    Rm14, alone, NP/internal ED referral for ocular migraine:1 in past 30 days that lasted all day has vision and memory problems, episode of memory loss: moca score of 28       ASSESSMENT AND PLAN  Dylan Hale is a 73 y.o. male   Long history of ocular migraine Episode of visual distortion with moderate headache, also brain fog,  most consistent with migraine variant, remote possibility of TIA  MoCA examination 28/30 today  MRI of the brain He is already on Eliquis  due to history of atrial fibrillation,    DIAGNOSTIC DATA (LABS, IMAGING, TESTING) - I reviewed patient records, labs, notes, testing and imaging myself where available.   MEDICAL HISTORY:  Dylan Hale is a 73 year old male, seen in request by his primary care from [facility] Dr. Mabel Savage, Geralyn Knee for evaluation of visual changes, initial evaluation December 21, 2023  History is obtained from the patient and review of electronic medical records. I personally reviewed pertinent available imaging films in PACS.   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 OSA-use CPAP,  BPH. A fib, Eliquis ,  Left shoulder surgery  He had a history of right retinal vitreous detachment in February 2025, presenting with floater, flashing of the light when looking at the right outer edge,  He had occasionally ocular migraine for over 10 years, often started with a black spot in his visual field, lasting for 20 to 30 minutes, followed by a moderate headache,  April 2023 looking at his iPad, he noticed missing letters at the right visual field, he also realized he has brain fog, cannot recall his wife or children's name, had a moderate headache, lasting 40 to 50 minutes, improved after taking a nap  He was seen by his family physician, concerned about vascular event, referred him to emergency room, CT head without contrast showed  no acute abnormality, evidence of layering fluid in the left maxillary sinus  CT cervical spine showed multilevel degenerative changes, no evidence of  Laboratory evaluations showed negative UDS, alcohol, normal CBC, CMP with creatinine of 1.3 He is under a lot of stress, taking care of his elderly parents, doing workout regularly without much difficulty, but he is concerned about his short-term memory loss, sometimes has word finding difficulties, MoCA examination 28/30   PHYSICAL EXAM:   Vitals:   12/21/23 0845  BP: 126/81  Pulse: 69  Weight: 186 lb 9.6 oz (84.6 kg)  Height: 5\' 10"  (1.778 m)    Body mass index is 26.77 kg/m.  PHYSICAL EXAMNIATION:  Gen: NAD, conversant, well nourised, well groomed                     Cardiovascular: Regular rate rhythm, no peripheral edema, warm, nontender. Eyes: Conjunctivae clear without exudates or hemorrhage Neck: Supple, no carotid bruits. Pulmonary: Clear to auscultation bilaterally   NEUROLOGICAL EXAM:  MENTAL STATUS: Speech/cognition: Awake, alert, oriented to history taking and casual conversation    12/21/2023    8:50 AM  Montreal Cognitive Assessment   Visuospatial/ Executive (0/5) 3  Naming (0/3) 3  Attention: Read list of digits (0/2) 2  Attention: Read list of letters (0/1) 1  Attention: Serial 7 subtraction starting at 100 (0/3) 3  Language: Repeat phrase (0/2) 2  Language : Fluency (0/1) 1  Abstraction (0/2) 2  Delayed Recall (0/5) 5  Orientation (0/6) 6  Total 28  Adjusted Score (based on education) 28    CRANIAL NERVES: CN II: Visual fields are full to confrontation. Pupils are round equal and briskly reactive to light. CN III, IV, VI: extraocular movement are normal. No ptosis. CN V: Facial sensation is intact to light touch CN VII: Face is symmetric with normal eye closure  CN VIII: Hearing is normal to causal conversation. CN IX, X: Phonation is normal. CN XI: Head turning and shoulder shrug are  intact  MOTOR: There is no pronator drift of out-stretched arms. Muscle bulk and tone are normal. Muscle strength is normal.  REFLEXES: Reflexes are 2+ and symmetric at the biceps, triceps, knees, and ankles. Plantar responses are flexor.  SENSORY: Intact to light touch, pinprick and vibratory sensation are intact in fingers and toes.  COORDINATION: There is no trunk or limb dysmetria noted.  GAIT/STANCE: Posture is normal. Gait is steady with normal steps, base, arm swing, and turning. Heel and toe walking are normal. Tandem gait is normal.  Romberg is absent.  REVIEW OF SYSTEMS:  Full 14 system review of systems performed and notable only for as above All other review of systems were negative.   ALLERGIES: Allergies  Allergen Reactions   Antihistamines, Chlorpheniramine-Type     Other Reaction(s): Not available   Sudafed [Pseudoephedrine] Other (See Comments)    jitters    HOME MEDICATIONS: Current Outpatient Medications  Medication Sig Dispense Refill   acetaminophen  (TYLENOL ) 500 MG tablet Take 500 mg by mouth every 6 (six) hours as needed for moderate pain.     apixaban  (ELIQUIS ) 5 MG TABS tablet Take 1 tablet (5 mg total) by mouth 2 (two) times daily. 60 tablet 5   aspirin EC 81 MG tablet Take 81 mg by mouth daily.     Ca Carbonate-Mag Hydroxide (ROLAIDS PO) Take 2 tablets by mouth daily as needed (heartburn).     cetirizine (ZYRTEC) 10 MG tablet Take 10 mg by mouth daily.     Cholecalciferol (VITAMIN D) 50 MCG (2000 UT) tablet Take 2,000 Units by mouth daily.     clonazePAM  (KLONOPIN ) 0.5 MG tablet Take 0.5 mg by mouth 2 (two) times daily.     Coenzyme Q10 200 MG capsule Take 200 mg by mouth every other day. With the rosuvastatin     finasteride (PROSCAR) 5 MG tablet Take 5 mg by mouth daily.     levothyroxine  (SYNTHROID , LEVOTHROID) 75 MCG tablet Take 75 mcg by mouth daily.     metoprolol  succinate (TOPROL -XL) 25 MG 24 hr tablet TAKE ONE-HALF TABLET EACH NIGHT.  (Patient taking differently: 12.5 mg daily as needed (heart rate greater than 100). TAKE ONE-HALF TABLET EACH NIGHT.) 45 tablet 3   Omega-3 Fatty Acids (FISH OIL) 1200 MG CAPS Take 1,200 mg by mouth daily.     PARoxetine  (PAXIL ) 20 MG tablet Take 20 mg by mouth daily.     rosuvastatin (CRESTOR) 10 MG tablet Take 10 mg by mouth every other day.     tamsulosin  (FLOMAX ) 0.4 MG CAPS capsule Take 0.4 mg by mouth daily.     Testosterone 20.25 MG/ACT (1.62%) GEL Apply 1-2 Pump topically every morning. On each shoulder     No current facility-administered medications for this visit.    PAST MEDICAL HISTORY: Past Medical History:  Diagnosis Date   Anxiety    Chest pain    Depression    Enlarged prostate    Hyperlipidemia    Hypogonadism male    Hypothyroidism  Plantar fibromatosis    right foot    PAST SURGICAL HISTORY: Past Surgical History:  Procedure Laterality Date   ATRIAL FIBRILLATION ABLATION N/A 03/14/2022   Procedure: ATRIAL FIBRILLATION ABLATION;  Surgeon: Boyce Byes, MD;  Location: [facility];  Service: Cardiovascular;  Laterality: N/A;   MASS EXCISION Right 08/02/2015   Procedure: EXCISION OF RIGHT FOOT MASS;  Surgeon: Amada Backer, MD;  Location: [facility];  Service: Orthopedics;  Laterality: Right;   SHOULDER SURGERY Left    Arthroscopic    FAMILY HISTORY: Family History  Problem Relation Age of Onset   Prostate cancer Father    Anxiety disorder Mother    Autoimmune disease Mother    Colon cancer Maternal Grandmother    Emphysema Maternal Grandfather    Lung cancer Paternal Grandfather     SOCIAL HISTORY: Social History   Socioeconomic History   Marital status: Married    Spouse name: Not on file   Number of children: 4   Years of education: Not on file   Highest education level: Not on file  Occupational History   Not on file  Tobacco Use   Smoking status: Never    Passive exposure: Past   Smokeless tobacco: Never    Tobacco comments:    Never smoke 04/10/22  Vaping Use   Vaping status: Never Used  Substance and Sexual Activity   Alcohol use: Yes    Alcohol/week: 3.0 - 5.0 standard drinks of alcohol    Types: 3 - 5 Cans of beer per week    Comment: 3-5 beers a week 04/10/22   Drug use: No   Sexual activity: Yes  Other Topics Concern   Not on file  Social History Narrative   Not on file   Social Drivers of Health   Financial Resource Strain: Not on file  Food Insecurity: No Food Insecurity (09/19/2021)   Received from [facility], [facility]   Hunger Vital Sign    Worried About Running Out of Food in the Last Year: Never true    Ran Out of Food in the Last Year: Never true  Transportation Needs: Not on file  Physical Activity: Not on file  Stress: Not on file  Social Connections: Unknown (12/26/2021)   Received from [facility], [facility]   Social Network    Social Network: Not on file  Intimate Partner Violence: Unknown (11/29/2021)   Received from [facility], [facility]   HITS    Physically Hurt: Not on file    Insult or Talk Down To: Not on file    Threaten Physical Harm: Not on file    Scream or Curse: Not on file      Phebe Brasil, M.D. Ph.D.  [facility] [address] [place], [place] [address] Ph: [phone] Fax: [phone]  CC:  Tonya Fredrickson, MD [address] [place],  [place] [address]  Alain Howard., MD

## 2023-12-23 ENCOUNTER — Telehealth: Payer: Self-pay | Admitting: Neurology

## 2023-12-23 ENCOUNTER — Ambulatory Visit

## 2023-12-23 DIAGNOSIS — R4189 Other symptoms and signs involving cognitive functions and awareness: Secondary | ICD-10-CM

## 2023-12-23 DIAGNOSIS — H5319 Other subjective visual disturbances: Secondary | ICD-10-CM | POA: Diagnosis not present

## 2023-12-23 MED ORDER — GADOBENATE DIMEGLUMINE 529 MG/ML IV SOLN
15.0000 mL | Freq: Once | INTRAVENOUS | Status: AC | PRN
  Administered 2023-12-23: 15 mL via INTRAVENOUS

## 2023-12-23 NOTE — Telephone Encounter (Signed)
 Pt called to schedule his MRI

## 2023-12-23 NOTE — Telephone Encounter (Signed)
 He is coming into [facility] today to get the MRI at noon.

## 2023-12-23 NOTE — Addendum Note (Signed)
 Addended by: Bernardette Waldron on: 12/23/2023 09:32 AM   Modules accepted: Orders

## 2023-12-28 ENCOUNTER — Telehealth: Payer: Self-pay | Admitting: Neurology

## 2023-12-28 ENCOUNTER — Encounter (INDEPENDENT_AMBULATORY_CARE_PROVIDER_SITE_OTHER): Admitting: Neurology

## 2023-12-28 DIAGNOSIS — H5319 Other subjective visual disturbances: Secondary | ICD-10-CM

## 2023-12-28 DIAGNOSIS — R4189 Other symptoms and signs involving cognitive functions and awareness: Secondary | ICD-10-CM

## 2023-12-28 NOTE — Telephone Encounter (Signed)
 Called and spoke to pt and stated: MRI of the brain showed age-related changes, there was no acute abnormalities   Pt voiced gratitude and understanding

## 2023-12-28 NOTE — Telephone Encounter (Signed)
 Please call patient, MRI of the brain showed age-related changes, there was no acute abnormalities

## 2023-12-28 NOTE — Telephone Encounter (Signed)
 Patient wanted to get the MRI results as soon as possible because they are traveling out of the country next week. I advised that Dr. Gracie Lav will advise once read and I wasn't sure of her schedule regarding results.

## 2023-12-29 NOTE — Telephone Encounter (Signed)
 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    This patient gave consent for this Medical Advice Message and is aware that it may result in a bill to [employer], as well as the possibility of receiving a bill for a co-payment or deductible. They are an established patient, but are not seeking medical advice exclusively about a problem treated during an in person or video visit in the last seven days. I did not recommend an in person or video visit within seven days of my reply.    I spent a total of 10 minutes cumulative time within 7 days through [employer].  Phebe Brasil, MD

## 2023-12-31 ENCOUNTER — Encounter: Payer: Self-pay | Admitting: Neurology

## 2023-12-31 DIAGNOSIS — R4189 Other symptoms and signs involving cognitive functions and awareness: Secondary | ICD-10-CM

## 2023-12-31 DIAGNOSIS — H5319 Other subjective visual disturbances: Secondary | ICD-10-CM

## 2024-01-04 ENCOUNTER — Telehealth: Payer: Self-pay | Admitting: Neurology

## 2024-01-04 NOTE — Telephone Encounter (Signed)
 Referral for ENT fax to [facility]. Phone: [phone], Fax: [phone]

## 2024-02-07 NOTE — Progress Notes (Signed)
  Electrophysiology Office Follow up Visit Note:    Date:  02/10/2024   ID:  Dylan Hale, DOB Feb 08, 1951, MRN [id number]  PCP:  Alain Howard., MD  [facility] Cardiologist:  Alexandria Angel, MD  [facility] Electrophysiologist:  Boyce Byes, MD    Interval History:     Dylan Hale is a 73 y.o. male who presents for a follow up visit.   I last saw the patient June 18, 2022 He had a catheter ablation March 14, 2022 during which the pulmonary veins were isolated.  He has done well since the catheter ablation and presents for follow-up. He takes Eliquis  for stroke prophylaxis.       Past medical, surgical, social and family history were reviewed.  ROS:   Please see the history of present illness.    All other systems reviewed and are negative.  EKGs/Labs/Other Studies Reviewed:    The following studies were reviewed today:     EKG Interpretation Date/Time:  Wednesday February 10 2024 10:36:37 EDT Ventricular Rate:  48 PR Interval:  174 QRS Duration:  92 QT Interval:  414 QTC Calculation: 369 R Axis:   4  Text Interpretation: Sinus bradycardia Confirmed by Harvie Liner [phone]) on 02/10/2024 10:51:09 AM    Physical Exam:    VS:  BP 118/69   Pulse (!) 48   Ht 5' 10 (1.778 m)   Wt 182 lb (82.6 kg)   PF 96 L/min   BMI 26.11 kg/m     Wt Readings from Last 3 Encounters:  02/10/24 182 lb (82.6 kg)  12/21/23 186 lb 9.6 oz (84.6 kg)  12/16/23 185 lb (83.9 kg)     GEN: no distress CARD: RRR, No MRG RESP: No IWOB. CTAB.      ASSESSMENT:    1. Paroxysmal atrial fibrillation (HCC)    PLAN:    In order of problems listed above:  #Atrial fibrillation Doing well after his July 2023 catheter ablation. Continue Eliquis  for stroke prophylaxis Continue metoprolol     Follow-up with EP on an as-needed basis   Signed, Harvie Liner, MD, [facility], [facility] 02/10/2024 10:56 AM    Electrophysiology [facility]

## 2024-02-10 ENCOUNTER — Ambulatory Visit: Attending: Cardiology | Admitting: Cardiology

## 2024-02-10 ENCOUNTER — Encounter: Payer: Self-pay | Admitting: Cardiology

## 2024-02-10 VITALS — BP 118/69 | HR 48 | Ht 70.0 in | Wt 182.0 lb

## 2024-02-10 DIAGNOSIS — I480 Paroxysmal atrial fibrillation: Secondary | ICD-10-CM

## 2024-02-10 NOTE — Patient Instructions (Signed)

## 2024-06-16 NOTE — Progress Notes (Deleted)
 [facility] [address] [place] [address] Phone: [phone] Subjective:    I'm seeing this patient by the request  of:  Alona Glendia BIRCH., MD  CC:   YEP:Dlagzrupcz  [facility] is a 73 y.o. male coming in with complaint of X shoulder pain. Patient states       Past Medical History:  Diagnosis Date   Anxiety    Chest pain    Depression    Enlarged prostate    Hyperlipidemia    Hypogonadism male    Hypothyroidism    Plantar fibromatosis    right foot   Past Surgical History:  Procedure Laterality Date   ATRIAL FIBRILLATION ABLATION N/A 03/14/2022   Procedure: ATRIAL FIBRILLATION ABLATION;  Surgeon: Cindie Ole DASEN, MD;  Location: [facility];  Service: Cardiovascular;  Laterality: N/A;   MASS EXCISION Right 08/02/2015   Procedure: EXCISION OF RIGHT FOOT MASS;  Surgeon: Norleen Armor, MD;  Location: [facility];  Service: Orthopedics;  Laterality: Right;   SHOULDER SURGERY Left    Arthroscopic   Social History   Socioeconomic History   Marital status: Married    Spouse name: Not on file   Number of children: 4   Years of education: Not on file   Highest education level: Not on file  Occupational History   Not on file  Tobacco Use   Smoking status: Never    Passive exposure: Past   Smokeless tobacco: Never   Tobacco comments:    Never smoke 04/10/22  Vaping Use   Vaping status: Never Used  Substance and Sexual Activity   Alcohol use: Yes    Alcohol/week: 3.0 - 5.0 standard drinks of alcohol    Types: 3 - 5 Cans of beer per week    Comment: 3-5 beers a week 04/10/22   Drug use: No   Sexual activity: Yes  Other Topics Concern   Not on file  Social History Narrative   Not on file   Social Drivers of Health   Financial Resource Strain: Not on file  Food Insecurity: No Food Insecurity (09/19/2021)   [facility]   Hunger Vital Sign    Within the past 12 months, you worried  that your food would run out before you got the money to buy more.: Never true    Within the past 12 months, the food you bought just didn't last and you didn't have money to get more.: Never true  Transportation Needs: Not on file  Physical Activity: Not on file  Stress: Not on file  Social Connections: Unknown (12/26/2021)   [facility]   Social Network    Social Network: Not on file   Allergies  Allergen Reactions   Antihistamines, Chlorpheniramine-Type     Other Reaction(s): Not available   Sudafed [Pseudoephedrine] Other (See Comments)    jitters   Family History  Problem Relation Age of Onset   Prostate cancer Father    Anxiety disorder Mother    Autoimmune disease Mother    Colon cancer Maternal Grandmother    Emphysema Maternal Grandfather    Lung cancer Paternal Grandfather     Current Outpatient Medications (Endocrine & Metabolic):    levothyroxine  (SYNTHROID , LEVOTHROID) 75 MCG tablet, Take 75 mcg by mouth daily.   Testosterone 20.25 MG/ACT (1.62%) GEL, Apply 1-2 Pump topically every morning. On each shoulder  Current Outpatient Medications (Cardiovascular):    metoprolol  succinate (TOPROL -XL) 25  MG 24 hr tablet, TAKE ONE-HALF TABLET EACH NIGHT. (Patient taking differently: 12.5 mg daily as needed (heart rate greater than 100). TAKE ONE-HALF TABLET EACH NIGHT.)   rosuvastatin (CRESTOR) 10 MG tablet, Take 10 mg by mouth every other day.  Current Outpatient Medications (Respiratory):    cetirizine (ZYRTEC) 10 MG tablet, Take 10 mg by mouth daily.  Current Outpatient Medications (Analgesics):    acetaminophen  (TYLENOL ) 500 MG tablet, Take 500 mg by mouth every 6 (six) hours as needed for moderate pain.   aspirin EC 81 MG tablet, Take 81 mg by mouth daily.  Current Outpatient Medications (Hematological):    apixaban  (ELIQUIS ) 5 MG TABS tablet, Take 1 tablet (5 mg total) by mouth 2 (two) times daily.  Current Outpatient Medications (Other):    Ca  Carbonate-Mag Hydroxide (ROLAIDS PO), Take 2 tablets by mouth daily as needed (heartburn).   Cholecalciferol (VITAMIN D) 50 MCG (2000 UT) tablet, Take 2,000 Units by mouth daily.   clonazePAM  (KLONOPIN ) 0.5 MG tablet, Take 0.5 mg by mouth 2 (two) times daily.   Coenzyme Q10 200 MG capsule, Take 200 mg by mouth every other day. With the rosuvastatin   finasteride (PROSCAR) 5 MG tablet, Take 5 mg by mouth daily.   Omega-3 Fatty Acids (FISH OIL) 1200 MG CAPS, Take 1,200 mg by mouth daily.   PARoxetine  (PAXIL ) 20 MG tablet, Take 20 mg by mouth daily.   tamsulosin  (FLOMAX ) 0.4 MG CAPS capsule, Take 0.4 mg by mouth daily.   Reviewed prior external information including notes and imaging from  primary care provider As well as notes that were available from care everywhere and other healthcare systems.  Past medical history, social, surgical and family history all reviewed in electronic medical record.  No pertanent information unless stated regarding to the chief complaint.   Review of Systems:  No headache, visual changes, nausea, vomiting, diarrhea, constipation, dizziness, abdominal pain, skin rash, fevers, chills, night sweats, weight loss, swollen lymph nodes, body aches, joint swelling, chest pain, shortness of breath, mood changes. POSITIVE muscle aches  Objective  There were no vitals taken for this visit.   General: No apparent distress alert and oriented x3 mood and affect normal, dressed appropriately.  HEENT: Pupils equal, extraocular movements intact  Respiratory: Patient's speak in full sentences and does not appear short of breath  Cardiovascular: No lower extremity edema, non tender, no erythema      Impression and Recommendations:

## 2024-06-21 ENCOUNTER — Ambulatory Visit: Admitting: Family Medicine

## 2025-02-08 ENCOUNTER — Ambulatory Visit: Admitting: Student in an Organized Health Care Education/Training Program
